# Patient Record
Sex: Female | Born: 1970 | Hispanic: No | Marital: Single | Smoking: Never smoker
Health system: Southern US, Community
[De-identification: ages and names within clinical notes are randomized; demographics above are authoritative.]

## PROBLEM LIST (undated history)

## (undated) DIAGNOSIS — E781 Pure hyperglyceridemia: Secondary | ICD-10-CM

## (undated) DIAGNOSIS — K219 Gastro-esophageal reflux disease without esophagitis: Secondary | ICD-10-CM

## (undated) DIAGNOSIS — R011 Cardiac murmur, unspecified: Secondary | ICD-10-CM

## (undated) DIAGNOSIS — N62 Hypertrophy of breast: Secondary | ICD-10-CM

## (undated) DIAGNOSIS — E282 Polycystic ovarian syndrome: Secondary | ICD-10-CM

## (undated) DIAGNOSIS — I10 Essential (primary) hypertension: Secondary | ICD-10-CM

## (undated) HISTORY — PX: NO PAST SURGERIES: SHX2092

## (undated) HISTORY — DX: Pure hyperglyceridemia: E78.1

---

## 2001-08-15 ENCOUNTER — Other Ambulatory Visit: Admission: RE | Admit: 2001-08-15 | Discharge: 2001-08-15 | Payer: Self-pay | Admitting: *Deleted

## 2002-10-10 ENCOUNTER — Encounter: Admission: RE | Admit: 2002-10-10 | Discharge: 2002-10-10 | Payer: Self-pay | Admitting: *Deleted

## 2003-01-22 ENCOUNTER — Other Ambulatory Visit: Admission: RE | Admit: 2003-01-22 | Discharge: 2003-01-22 | Payer: Self-pay | Admitting: Obstetrics and Gynecology

## 2005-03-03 ENCOUNTER — Other Ambulatory Visit: Admission: RE | Admit: 2005-03-03 | Discharge: 2005-03-03 | Payer: Self-pay | Admitting: Obstetrics and Gynecology

## 2007-10-03 IMAGING — CT CT ABD-PELV W/O CM
3 of 8 series · 12 of 42 positions shown, 18 images · IV contrast (CONTRAST)
Comparison: NONE

CLINICAL DATA: Lower right-sided pains. 

CT OF THE ABDOMEN AND PELVIS WITHOUT AND WITH INTRAVENOUS AND 
FOLLOWING  ORAL CONTRAST
TECHNIQUE: Multiple axial 5 millimeter thick 
slices at 5 millimeter intervals were obtained from the lung base 
through the pelvis following the intravenous administration of 100 
cc???s of Optiray 350 at a rate of 3 cc???s per second. Oral contrast 
was administered as well.  Arterial and venous phase imaging was 
obtained in the upper abdomen with delayed images obtained through 
the pelvis.

[Series 4: venous · axial · portal-venous · 0.76mm/px · z∈[+1184,+1494]mm · 5 of 94 slices shown, 10 images]
[im 16/94  soft-tissue]
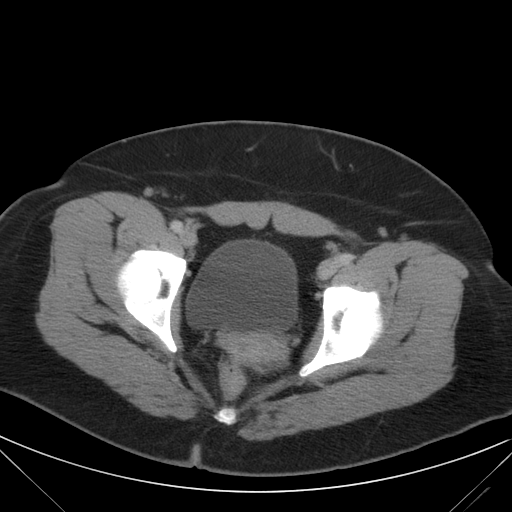
[im 16/94  bone]
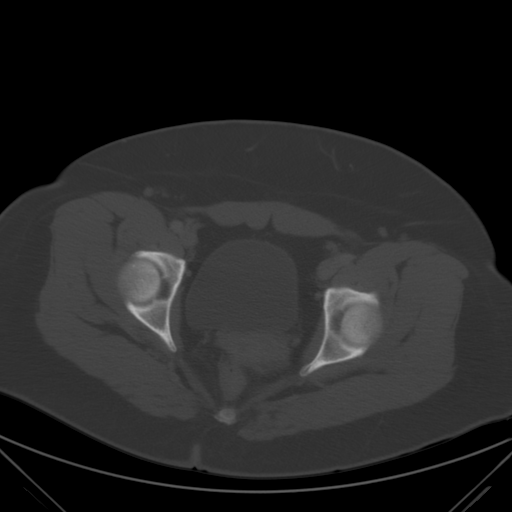
[im 32/94  soft-tissue]
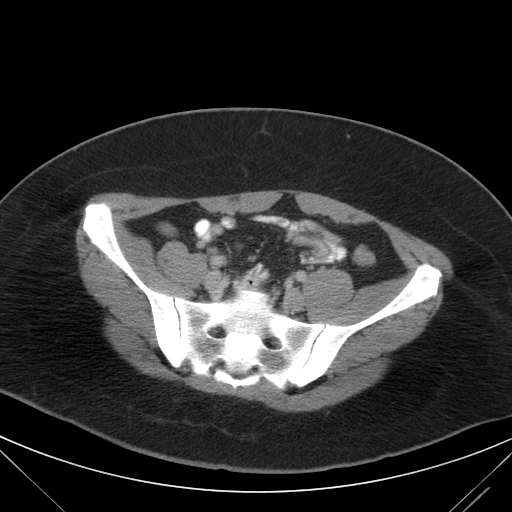
[im 32/94  lung]
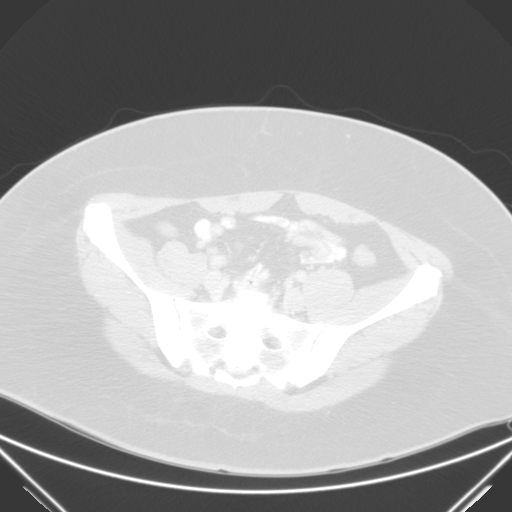
[im 47/94  soft-tissue]
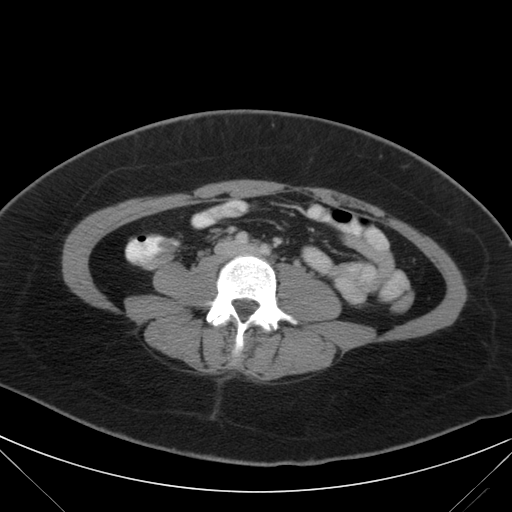
[im 47/94  lung]
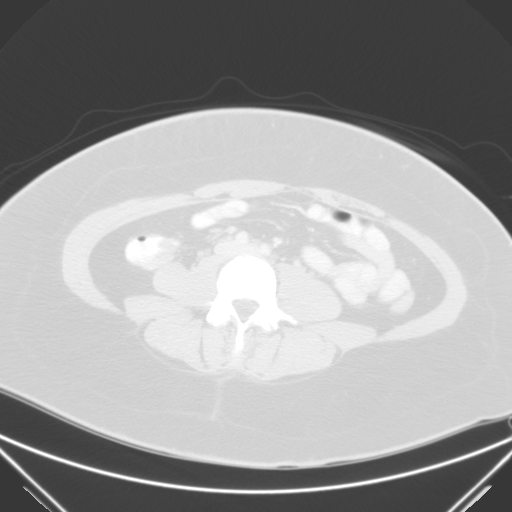
[im 63/94  soft-tissue]
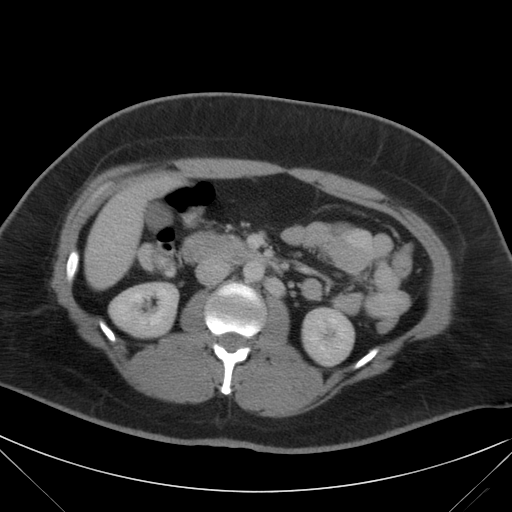
[im 63/94  lung]
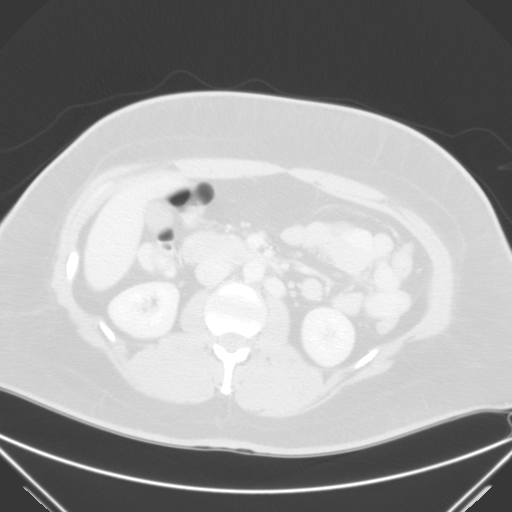
[im 78/94  soft-tissue]
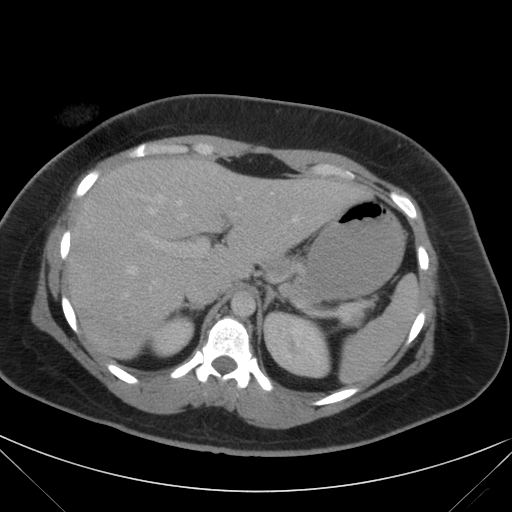
[im 78/94  lung]
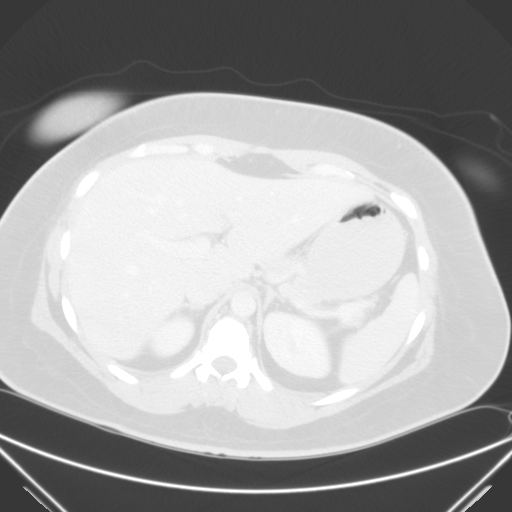

[Series 9: bladder delays · axial · 0.77mm/px · z∈[+1217,+1442]mm · 4 of 75 slices shown]
[im 15/75  soft-tissue]
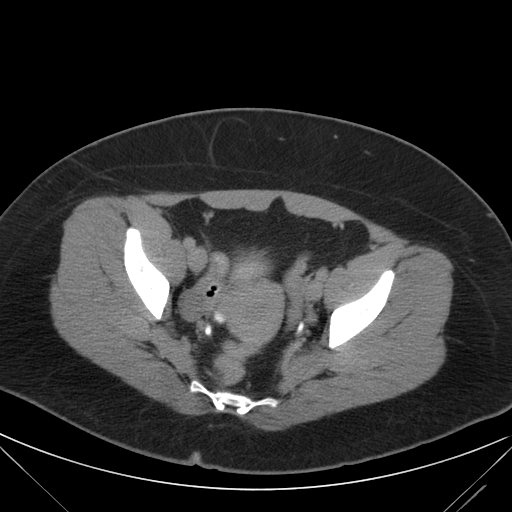
[im 30/75  soft-tissue]
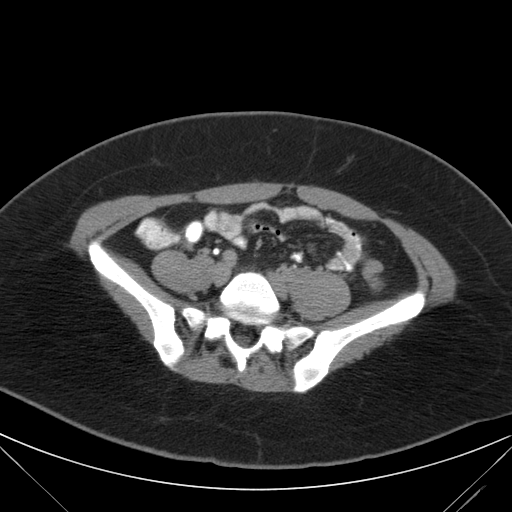
[im 45/75  soft-tissue]
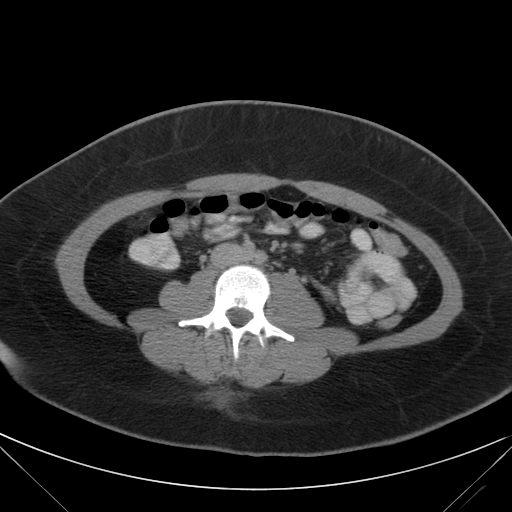
[im 60/75  soft-tissue]
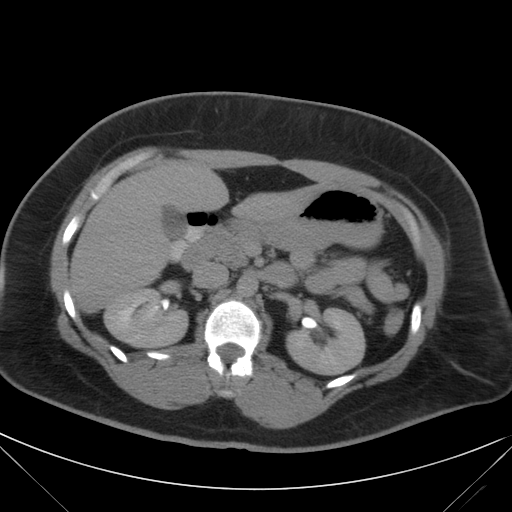

[coronals · coronal · 0.91mm/px · 3 of 70 slices shown, 4 images]
[im 24/70  soft-tissue]
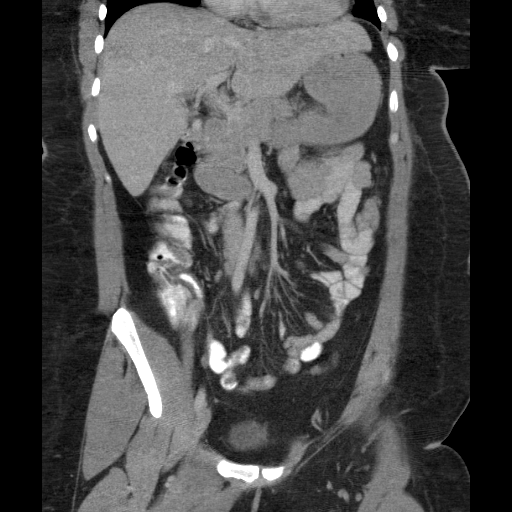
[im 31/70  soft-tissue]
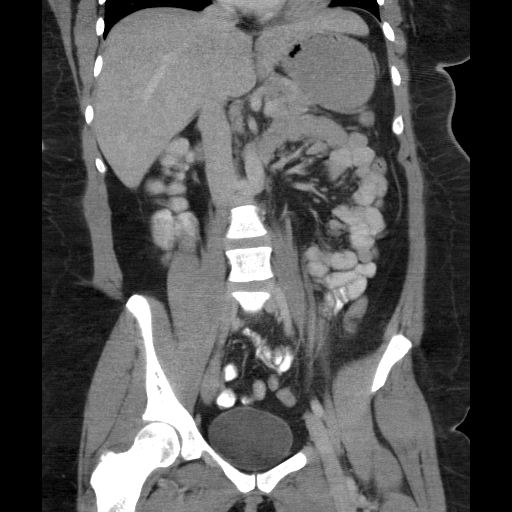
[im 31/70  bone]
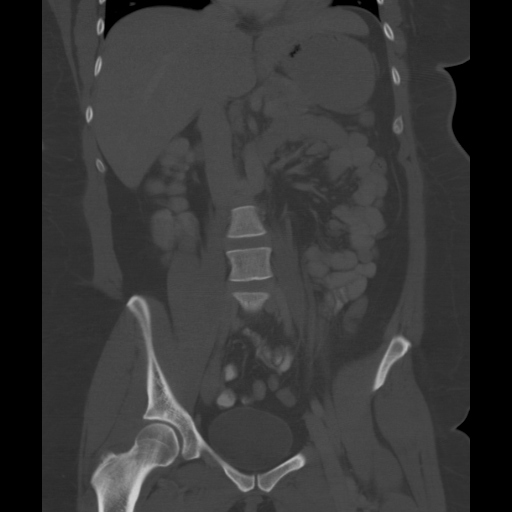
[im 39/70  soft-tissue]
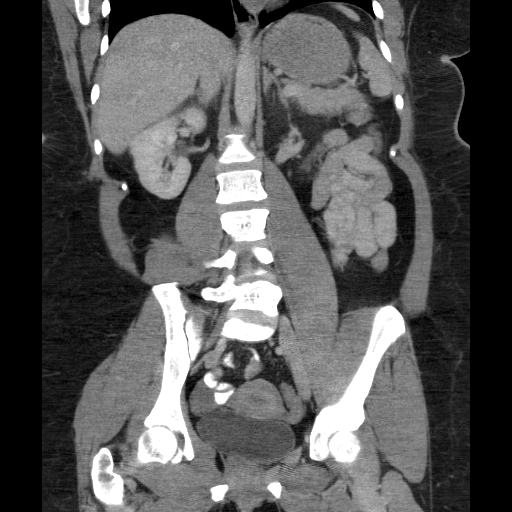

[12 of 42 positions shown; findings below may reference images not displayed]

FINDINGS: No gallstones are identified. No 
renal calculi are identified. The liver, gallbladder, and spleen 
are unremarkable.  No focal or diffuse enlargement is noted of the 
pancreas.  There is no evidence of upper abdominal or 
retroperitoneal mass, adenopathy, or aneurysm. No adrenal or renal 
masses are noted.  There is no evidence of obstructive uropathy. 
No bowel, mesenteric, pelvic, or inguinal mass, adenopathy, or 
inflammatory process. No evidence of appendicitis, diverticulitis, 
hernia, or bowel obstruction. No lung base mass, infiltrate, 
edema, or effusion. No lytic or blastic lesions are identified.
IMPRESSION: Negative CT of the abdomen and pelvis.

## 2017-10-10 ENCOUNTER — Ambulatory Visit: Payer: Self-pay | Admitting: Plastic Surgery

## 2017-10-10 DIAGNOSIS — N62 Hypertrophy of breast: Secondary | ICD-10-CM

## 2017-10-10 NOTE — H&P (View-Only) (Signed)
Michelle Frye is an 47 y.o. female.   Chief Complaint: mammary hypertrophy HPI:  Michelle Frye is a 46 y.o. female with a history of mammary hyperplasia for several years.  She has extremely large breasts causing symptoms that include the following complaints: Back pain (upper and lower) and neck pain. She frequently pins bra cups higher on straps for better lift and relief. Notices relief when holding breast up in her hands. Shoulder straps causing grooves, pain occasionally requiring padding. Pain medication is sometimes required with motrin and tylenol.  She is finding it very difficult to find a bra that fits.  Activities that are hindered by enlarged breasts include: running and exercising. Mammogram history: will need results.  Her breasts are extremely large and fairly symmetric.  She has hyperpigmentation of the inframammary area on both sides.   She is 5 feet 5 inches tall and weighs 200 pounds.   Preoperative bra size = 38 G cup.   Nipple to inframammary fold distance:  12 cm Sternal Noth to nipple distance:  Right = 33 cm, Left = 33 cm Internipple distance:  20 cm Nipple to Midline Distance:  11 cm The estimated excess breast tissue to be removed at the time of surgery = 800 grams on the left and 800 grams on the right.    No past medical history on file.    No family history on file. Social History:  has no tobacco, alcohol, and drug history on file.  Allergies: Allergies not on file   (Not in a hospital admission)  No results found for this or any previous visit (from the past 48 hour(s)). No results found.  Review of Systems  Constitutional: Negative.   HENT: Negative.   Eyes: Negative.   Respiratory: Negative.   Cardiovascular: Negative.   Gastrointestinal: Negative.   Genitourinary: Negative.   Musculoskeletal: Positive for back pain and neck pain.  Skin: Negative.   Neurological: Negative.   Psychiatric/Behavioral: Negative.     There were no vitals  taken for this visit. Physical Exam  Constitutional: She is oriented to person, place, and time. She appears well-developed and well-nourished.  HENT:  Head: Normocephalic and atraumatic.  Eyes: Pupils are equal, round, and reactive to light. EOM are normal.  Cardiovascular: Normal rate.  Respiratory: Effort normal.  GI: Soft.  Neurological: She is alert and oriented to person, place, and time.  Skin: Skin is warm.  Psychiatric: She has a normal mood and affect. Her behavior is normal. Judgment and thought content normal.     Assessment/Plan Bilateral breast reduction.  Risks and complication were discussed.  Adelene Polivka S Kaylinn Dedic, DO 10/10/2017, 9:50 AM   

## 2017-10-10 NOTE — H&P (Signed)
Michelle Frye is an 47 y.o. female.   Chief Complaint: mammary hypertrophy HPI:  Michelle Frye is a 47 y.o. female with a history of mammary hyperplasia for several years.  She has extremely large breasts causing symptoms that include the following complaints: Back pain (upper and lower) and neck pain. She frequently pins bra cups higher on straps for better lift and relief. Notices relief when holding breast up in her hands. Shoulder straps causing grooves, pain occasionally requiring padding. Pain medication is sometimes required with motrin and tylenol.  She is finding it very difficult to find a bra that fits.  Activities that are hindered by enlarged breasts include: running and exercising. Mammogram history: will need results.  Her breasts are extremely large and fairly symmetric.  She has hyperpigmentation of the inframammary area on both sides.   She is 5 feet 5 inches tall and weighs 200 pounds.   Preoperative bra size = 38 G cup.   Nipple to inframammary fold distance:  12 cm Sternal Noth to nipple distance:  Right = 33 cm, Left = 33 cm Internipple distance:  20 cm Nipple to Midline Distance:  11 cm The estimated excess breast tissue to be removed at the time of surgery = 800 grams on the left and 800 grams on the right.    No past medical history on file.    No family history on file. Social History:  has no tobacco, alcohol, and drug history on file.  Allergies: Allergies not on file   (Not in a hospital admission)  No results found for this or any previous visit (from the past 48 hour(s)). No results found.  Review of Systems  Constitutional: Negative.   HENT: Negative.   Eyes: Negative.   Respiratory: Negative.   Cardiovascular: Negative.   Gastrointestinal: Negative.   Genitourinary: Negative.   Musculoskeletal: Positive for back pain and neck pain.  Skin: Negative.   Neurological: Negative.   Psychiatric/Behavioral: Negative.     There were no vitals  taken for this visit. Physical Exam  Constitutional: She is oriented to person, place, and time. She appears well-developed and well-nourished.  HENT:  Head: Normocephalic and atraumatic.  Eyes: Pupils are equal, round, and reactive to light. EOM are normal.  Cardiovascular: Normal rate.  Respiratory: Effort normal.  GI: Soft.  Neurological: She is alert and oriented to person, place, and time.  Skin: Skin is warm.  Psychiatric: She has a normal mood and affect. Her behavior is normal. Judgment and thought content normal.     Assessment/Plan Bilateral breast reduction.  Risks and complication were discussed.  Alena BillsClaire S Aquan Kope, DO 10/10/2017, 9:50 AM

## 2017-10-24 ENCOUNTER — Encounter: Payer: Self-pay | Admitting: Cardiovascular Disease

## 2017-10-24 ENCOUNTER — Ambulatory Visit (HOSPITAL_COMMUNITY): Payer: BC Managed Care – PPO | Attending: Cardiovascular Disease

## 2017-10-24 ENCOUNTER — Ambulatory Visit: Payer: BC Managed Care – PPO | Admitting: Cardiovascular Disease

## 2017-10-24 ENCOUNTER — Other Ambulatory Visit: Payer: Self-pay

## 2017-10-24 VITALS — BP 118/74 | HR 79 | Ht 64.5 in | Wt 213.0 lb

## 2017-10-24 DIAGNOSIS — R011 Cardiac murmur, unspecified: Secondary | ICD-10-CM | POA: Insufficient documentation

## 2017-10-24 DIAGNOSIS — Z8249 Family history of ischemic heart disease and other diseases of the circulatory system: Secondary | ICD-10-CM | POA: Diagnosis not present

## 2017-10-24 DIAGNOSIS — Z01818 Encounter for other preprocedural examination: Secondary | ICD-10-CM | POA: Diagnosis not present

## 2017-10-24 LAB — ECHOCARDIOGRAM COMPLETE
Height: 64.5 in
Weight: 3408 oz

## 2017-10-24 NOTE — Progress Notes (Signed)
10/24/2017 Michelle Frye   30-Aug-1970  [id number]  Primary Physician Michelle Rainier, MD Primary Cardiologist: Runell Gess MD Michelle Fry[place], [place]  HPI:  Michelle Frye is a 47 y.o. mildly overweight single Caucasian female with no children who works as a Hotel manager school.  Referred by Dr. Juluis Frye for preoperative clearance before elective reduction mammoplasty scheduled to be performed by Dr.Dillingham  next Monday.  She has no cardiac risk factors.  Both her parents however had mitral valve replacements.  Never had a heart attack or stroke.  She denies chest pain or shortness of breath.  She does have a murmur which was heard by multiple people.  She had a negative stress test 10 years ago by Dr. Anne Frye in the setting of chest pain which turned out to be just related to anxiety.   Current Meds  Medication Sig  . losartan-hydrochlorothiazide (HYZAAR) 50-12.5 MG tablet Take 1 tablet by mouth daily.  . metFORMIN (GLUCOPHAGE) 500 MG tablet Take 500 mg by mouth daily.  Marland Kitchen PREVIFEM 0.25-35 MG-MCG tablet Take 1 tablet by mouth daily.  Marland Kitchen spironolactone (ALDACTONE) 50 MG tablet Take 50 mg by mouth daily.     No Known Allergies  Social History   Socioeconomic History  . Marital status: Single    Spouse name: Not on file  . Number of children: Not on file  . Years of education: Not on file  . Highest education level: Not on file  Occupational History  . Not on file  Social Needs  . Financial resource strain: Not on file  . Food insecurity:    Worry: Not on file    Inability: Not on file  . Transportation needs:    Medical: Not on file    Non-medical: Not on file  Tobacco Use  . Smoking status: Never Smoker  . Smokeless tobacco: Never Used  Substance and Sexual Activity  . Alcohol use: Not on file  . Drug use: Not on file  . Sexual activity: Not on file  Lifestyle  . Physical activity:    Days per week: Not on file    Minutes per session:  Not on file  . Stress: Not on file  Relationships  . Social connections:    Talks on phone: Not on file    Gets together: Not on file    Attends religious service: Not on file    Active member of club or organization: Not on file    Attends meetings of clubs or organizations: Not on file    Relationship status: Not on file  . Intimate partner violence:    Fear of current or ex partner: Not on file    Emotionally abused: Not on file    Physically abused: Not on file    Forced sexual activity: Not on file  Other Topics Concern  . Not on file  Social History Narrative  . Not on file     Review of Systems: General: negative for chills, fever, night sweats or weight changes.  Cardiovascular: negative for chest pain, dyspnea on exertion, edema, orthopnea, palpitations, paroxysmal nocturnal dyspnea or shortness of breath Dermatological: negative for rash Respiratory: negative for cough or wheezing Urologic: negative for hematuria Abdominal: negative for nausea, vomiting, diarrhea, bright red blood per rectum, melena, or hematemesis Neurologic: negative for visual changes, syncope, or dizziness All other systems reviewed and are otherwise negative except as noted above.    Blood pressure 118/74, pulse 79,  height 5' 4.5" (1.638 m), weight 213 lb (96.6 kg).  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 Extremities: extremities normal, atraumatic, no cyanosis or edema Pulses: 2+ and symmetric Skin: Skin color, texture, turgor normal. No rashes or lesions Neurologic: Alert and oriented X 3, normal strength and tone. Normal symmetric reflexes. Normal coordination and gait  EKG sinus rhythm at 79 without ST or T wave changes.  I personally  reviewed this EKG.  ASSESSMENT AND PLAN:   Cardiac murmur [name] was referred by Michelle RainierElizabeth Frye for preoperative clearance for reduction mammoplasty scheduled to be performed by Dr. Foster Simpsonlaire Frye on 10/30/2017.  He does have a outflow tract murmur on exam.  She is otherwise asymptomatic.  I am going get a 2D echo to further evaluate.  If this is normal she will be cleared for upcoming surgery at low cardiovascular risk and I will see her back as needed.      Runell GessJonathan J. Farrell Broerman MD FACP,FACC,FAHA, [facility] ([facility] CenterFSCAI 10/24/2017 3:12 PM

## 2017-10-24 NOTE — Patient Instructions (Signed)
Medication Instructions:   NO CHANGE  Testing/Procedures:  Your physician has requested that you have an echocardiogram. Echocardiography is a painless test that uses sound waves to create images of your heart. It provides your doctor with information about the size and shape of your heart and how well your heart's chambers and valves are working. This procedure takes approximately one hour. There are no restrictions for this procedure.    Follow-Up:  Your physician recommends that you schedule a follow-up appointment in: AS NEEDED      

## 2017-10-24 NOTE — Addendum Note (Signed)
Addended by: Freddi StarrMATHIS, Mady Oubre W on: 10/24/2017 03:17 PM   Modules accepted: Orders

## 2017-10-24 NOTE — Assessment & Plan Note (Signed)
Ms. Michelle Frye was referred by Juluis RainierElizabeth Barnes for preoperative clearance for reduction mammoplasty scheduled to be performed by Dr. Foster Simpsonlaire Dillingham on 10/30/2017.  He does have a outflow tract murmur on exam.  She is otherwise asymptomatic.  I am going get a 2D echo to further evaluate.  If this is normal she will be cleared for upcoming surgery at low cardiovascular risk and I will see her back as needed.

## 2017-10-25 ENCOUNTER — Ambulatory Visit: Payer: Self-pay | Admitting: Plastic Surgery

## 2017-10-25 ENCOUNTER — Encounter (HOSPITAL_BASED_OUTPATIENT_CLINIC_OR_DEPARTMENT_OTHER): Payer: Self-pay | Admitting: *Deleted

## 2017-10-25 ENCOUNTER — Other Ambulatory Visit: Payer: Self-pay

## 2017-10-26 ENCOUNTER — Encounter (HOSPITAL_BASED_OUTPATIENT_CLINIC_OR_DEPARTMENT_OTHER)
Admission: RE | Admit: 2017-10-26 | Discharge: 2017-10-26 | Disposition: A | Discharge status: Home / Self Care | Payer: BC Managed Care – PPO | Source: Ambulatory Visit | Attending: Plastic Surgery | Admitting: Plastic Surgery

## 2017-10-26 DIAGNOSIS — Z01812 Encounter for preprocedural laboratory examination: Secondary | ICD-10-CM | POA: Insufficient documentation

## 2017-10-26 LAB — BASIC METABOLIC PANEL
Anion gap: 11 (ref 5–15)
BUN: 11 mg/dL (ref 6–20)
CHLORIDE: 101 mmol/L (ref 98–111)
CO2: 26 mmol/L (ref 22–32)
CREATININE: 0.81 mg/dL (ref 0.44–1.00)
Calcium: 9 mg/dL (ref 8.9–10.3)
GFR calc Af Amer: >60 mL/min (ref >60)
GFR calc non Af Amer: >60 mL/min (ref >60)
Glucose, Bld: 89 mg/dL (ref 70–99)
Potassium: 3.8 mmol/L (ref 3.5–5.1)
SODIUM: 138 mmol/L (ref 135–145)

## 2017-10-27 ENCOUNTER — Ambulatory Visit: Payer: Self-pay | Admitting: Plastic Surgery

## 2017-10-30 ENCOUNTER — Encounter (HOSPITAL_BASED_OUTPATIENT_CLINIC_OR_DEPARTMENT_OTHER): Payer: Self-pay | Admitting: Anesthesiology

## 2017-10-30 ENCOUNTER — Ambulatory Visit (HOSPITAL_BASED_OUTPATIENT_CLINIC_OR_DEPARTMENT_OTHER): Payer: BC Managed Care – PPO | Admitting: Anesthesiology

## 2017-10-30 ENCOUNTER — Encounter (HOSPITAL_BASED_OUTPATIENT_CLINIC_OR_DEPARTMENT_OTHER)
Admission: RE | Disposition: A | Discharge status: Home / Self Care | Payer: Self-pay | Source: Ambulatory Visit | Attending: Plastic Surgery

## 2017-10-30 ENCOUNTER — Other Ambulatory Visit: Payer: Self-pay

## 2017-10-30 ENCOUNTER — Ambulatory Visit (HOSPITAL_BASED_OUTPATIENT_CLINIC_OR_DEPARTMENT_OTHER)
Admission: RE | Admit: 2017-10-30 | Discharge: 2017-10-30 | Disposition: A | Discharge status: Home / Self Care | Payer: BC Managed Care – PPO | Source: Ambulatory Visit | Attending: Plastic Surgery | Admitting: Plastic Surgery

## 2017-10-30 DIAGNOSIS — I10 Essential (primary) hypertension: Secondary | ICD-10-CM | POA: Diagnosis not present

## 2017-10-30 DIAGNOSIS — N62 Hypertrophy of breast: Secondary | ICD-10-CM | POA: Diagnosis not present

## 2017-10-30 DIAGNOSIS — Z793 Long term (current) use of hormonal contraceptives: Secondary | ICD-10-CM | POA: Diagnosis not present

## 2017-10-30 DIAGNOSIS — K219 Gastro-esophageal reflux disease without esophagitis: Secondary | ICD-10-CM | POA: Diagnosis not present

## 2017-10-30 HISTORY — DX: Cardiac murmur, unspecified: R01.1

## 2017-10-30 HISTORY — DX: Polycystic ovarian syndrome: E28.2

## 2017-10-30 HISTORY — PX: BREAST REDUCTION SURGERY: SHX8

## 2017-10-30 HISTORY — DX: Gastro-esophageal reflux disease without esophagitis: K21.9

## 2017-10-30 HISTORY — DX: Hypertrophy of breast: N62

## 2017-10-30 HISTORY — DX: Essential (primary) hypertension: I10

## 2017-10-30 SURGERY — MAMMOPLASTY, REDUCTION
Anesthesia: General | Site: Breast | Laterality: Bilateral

## 2017-10-30 MED ORDER — ACETAMINOPHEN 325 MG PO TABS: 650.0000 mg | ORAL_TABLET | ORAL | Status: DC | PRN

## 2017-10-30 MED ORDER — ACETAMINOPHEN 650 MG RE SUPP: 650.0000 mg | RECTAL | Status: DC | PRN

## 2017-10-30 MED ORDER — SODIUM CHLORIDE 0.9 % IV SOLN: 250.0000 mL | INTRAVENOUS | Status: DC | PRN

## 2017-10-30 MED ORDER — FENTANYL CITRATE (PF) 100 MCG/2ML IJ SOLN
INTRAMUSCULAR | Status: AC
  Filled 2017-10-30: qty 2

## 2017-10-30 MED ORDER — KETOROLAC TROMETHAMINE 0.5 % OP SOLN
OPHTHALMIC | Status: AC
  Filled 2017-10-30: qty 5

## 2017-10-30 MED ORDER — FENTANYL CITRATE (PF) 100 MCG/2ML IJ SOLN
50.0000 ug | INTRAMUSCULAR | Status: AC | PRN
  Administered 2017-10-30: 100 ug via INTRAVENOUS
  Administered 2017-10-30: 50 ug via INTRAVENOUS
  Administered 2017-10-30: 25 ug via INTRAVENOUS
  Administered 2017-10-30: 50 ug via INTRAVENOUS

## 2017-10-30 MED ORDER — LACTATED RINGERS IV SOLN
INTRAVENOUS | Status: DC
  Administered 2017-10-30 (×4): via INTRAVENOUS

## 2017-10-30 MED ORDER — BUPIVACAINE-EPINEPHRINE (PF) 0.5% -1:200000 IJ SOLN
INTRAMUSCULAR | Status: AC
  Filled 2017-10-30: qty 60

## 2017-10-30 MED ORDER — PROPOFOL 10 MG/ML IV BOLUS
INTRAVENOUS | Status: DC | PRN
  Administered 2017-10-30: 250 mg via INTRAVENOUS

## 2017-10-30 MED ORDER — LIDOCAINE 2% (20 MG/ML) 5 ML SYRINGE
INTRAMUSCULAR | Status: DC | PRN
  Administered 2017-10-30: 80 mg via INTRAVENOUS

## 2017-10-30 MED ORDER — POLYMYXIN B-TRIMETHOPRIM 10000-0.1 UNIT/ML-% OP SOLN
OPHTHALMIC | Status: AC
  Filled 2017-10-30: qty 10

## 2017-10-30 MED ORDER — DEXAMETHASONE SODIUM PHOSPHATE 4 MG/ML IJ SOLN
INTRAMUSCULAR | Status: DC | PRN
  Administered 2017-10-30: 10 mg via INTRAVENOUS

## 2017-10-30 MED ORDER — POLYMYXIN B-TRIMETHOPRIM 10000-0.1 UNIT/ML-% OP SOLN
1.0000 [drp] | Freq: Three times a day (TID) | OPHTHALMIC | Status: DC
  Administered 2017-10-30: 1 [drp] via OPHTHALMIC

## 2017-10-30 MED ORDER — MIDAZOLAM HCL 2 MG/2ML IJ SOLN
1.0000 mg | INTRAMUSCULAR | Status: DC | PRN
  Administered 2017-10-30: 2 mg via INTRAVENOUS

## 2017-10-30 MED ORDER — TETRACAINE HCL 0.5 % OP SOLN
1.0000 [drp] | Freq: Once | OPHTHALMIC | Status: AC
  Administered 2017-10-30: 1 [drp] via OPHTHALMIC

## 2017-10-30 MED ORDER — SCOPOLAMINE 1 MG/3DAYS TD PT72
1.0000 | MEDICATED_PATCH | Freq: Once | TRANSDERMAL | Status: DC | PRN
  Administered 2017-10-30: 1.5 mg via TRANSDERMAL

## 2017-10-30 MED ORDER — OXYCODONE HCL 5 MG PO TABS: 5.0000 mg | ORAL_TABLET | ORAL | Status: DC | PRN

## 2017-10-30 MED ORDER — CEFAZOLIN SODIUM-DEXTROSE 2-4 GM/100ML-% IV SOLN
2.0000 g | INTRAVENOUS | Status: AC
  Administered 2017-10-30: 2 g via INTRAVENOUS

## 2017-10-30 MED ORDER — EPHEDRINE SULFATE 50 MG/ML IJ SOLN
INTRAMUSCULAR | Status: DC | PRN
  Administered 2017-10-30 (×2): 10 mg via INTRAVENOUS

## 2017-10-30 MED ORDER — SODIUM CHLORIDE 0.9 % IN NEBU
INHALATION_SOLUTION | RESPIRATORY_TRACT | Status: AC
  Filled 2017-10-30: qty 3

## 2017-10-30 MED ORDER — FENTANYL CITRATE (PF) 100 MCG/2ML IJ SOLN
25.0000 ug | INTRAMUSCULAR | Status: DC | PRN
  Administered 2017-10-30 (×2): 25 ug via INTRAVENOUS

## 2017-10-30 MED ORDER — ONDANSETRON HCL 4 MG/2ML IJ SOLN
INTRAMUSCULAR | Status: DC | PRN
  Administered 2017-10-30: 4 mg via INTRAVENOUS

## 2017-10-30 MED ORDER — BUPIVACAINE-EPINEPHRINE 0.25% -1:200000 IJ SOLN
INTRAMUSCULAR | Status: DC | PRN
  Administered 2017-10-30: 20 mL

## 2017-10-30 MED ORDER — ROCURONIUM BROMIDE 100 MG/10ML IV SOLN
INTRAVENOUS | Status: DC | PRN
  Administered 2017-10-30: 50 mg via INTRAVENOUS
  Administered 2017-10-30 (×2): 20 mg via INTRAVENOUS

## 2017-10-30 MED ORDER — SODIUM CHLORIDE 0.9% FLUSH: 3.0000 mL | Freq: Two times a day (BID) | INTRAVENOUS | Status: DC

## 2017-10-30 MED ORDER — MIDAZOLAM HCL 2 MG/2ML IJ SOLN
INTRAMUSCULAR | Status: AC
  Filled 2017-10-30: qty 2

## 2017-10-30 MED ORDER — SODIUM CHLORIDE 0.9% FLUSH: 3.0000 mL | INTRAVENOUS | Status: DC | PRN

## 2017-10-30 MED ORDER — TETRACAINE HCL 0.5 % OP SOLN
OPHTHALMIC | Status: AC
  Filled 2017-10-30: qty 4

## 2017-10-30 MED ORDER — PROMETHAZINE HCL 25 MG/ML IJ SOLN
6.2500 mg | INTRAMUSCULAR | Status: DC | PRN
Start: 2017-10-30 — End: 2017-10-30

## 2017-10-30 MED ORDER — SUGAMMADEX SODIUM 200 MG/2ML IV SOLN
INTRAVENOUS | Status: DC | PRN
  Administered 2017-10-30: 200 mg via INTRAVENOUS

## 2017-10-30 MED ORDER — BSS IO SOLN
15.0000 mL | Freq: Once | INTRAOCULAR | Status: AC
  Administered 2017-10-30: 15 mL

## 2017-10-30 MED ORDER — SCOPOLAMINE 1 MG/3DAYS TD PT72
MEDICATED_PATCH | TRANSDERMAL | Status: AC
  Filled 2017-10-30: qty 1

## 2017-10-30 MED ORDER — LIDOCAINE HCL (PF) 1 % IJ SOLN
INTRAMUSCULAR | Status: AC
  Filled 2017-10-30: qty 90

## 2017-10-30 MED ORDER — BSS IO SOLN
INTRAOCULAR | Status: AC
  Filled 2017-10-30: qty 15

## 2017-10-30 MED ORDER — CEFAZOLIN SODIUM-DEXTROSE 2-4 GM/100ML-% IV SOLN
INTRAVENOUS | Status: AC
  Filled 2017-10-30: qty 100

## 2017-10-30 MED ORDER — KETOROLAC TROMETHAMINE 0.5 % OP SOLN
1.0000 [drp] | Freq: Three times a day (TID) | OPHTHALMIC | Status: DC | PRN
  Administered 2017-10-30: 1 [drp] via OPHTHALMIC

## 2017-10-30 SURGICAL SUPPLY — 66 items
ADH SKN CLS APL DERMABOND .7 (GAUZE/BANDAGES/DRESSINGS)
BAG DECANTER FOR FLEXI CONT (MISCELLANEOUS) ×3 IMPLANT
BINDER BREAST LRG (GAUZE/BANDAGES/DRESSINGS) IMPLANT
BINDER BREAST MEDIUM (GAUZE/BANDAGES/DRESSINGS) IMPLANT
BINDER BREAST XLRG (GAUZE/BANDAGES/DRESSINGS) IMPLANT
BINDER BREAST XXLRG (GAUZE/BANDAGES/DRESSINGS) ×2 IMPLANT
BIOPATCH RED 1 DISK 7.0 (GAUZE/BANDAGES/DRESSINGS) ×2 IMPLANT
BIOPATCH RED 1IN DISK 7.0MM (GAUZE/BANDAGES/DRESSINGS) ×2
BLADE HEX COATED 2.75 (ELECTRODE) ×3 IMPLANT
BLADE KNIFE PERSONA 10 (BLADE) ×6 IMPLANT
BLADE SURG 15 STRL LF DISP TIS (BLADE) IMPLANT
BLADE SURG 15 STRL SS (BLADE)
BNDG GAUZE ELAST 4 BULKY (GAUZE/BANDAGES/DRESSINGS) ×6 IMPLANT
CANISTER SUCT 1200ML W/VALVE (MISCELLANEOUS) ×3 IMPLANT
CHLORAPREP W/TINT 26ML (MISCELLANEOUS) ×3 IMPLANT
COVER BACK TABLE 60X90IN (DRAPES) ×3 IMPLANT
COVER MAYO STAND STRL (DRAPES) ×3 IMPLANT
DECANTER SPIKE VIAL GLASS SM (MISCELLANEOUS) IMPLANT
DERMABOND ADVANCED (GAUZE/BANDAGES/DRESSINGS)
DERMABOND ADVANCED .7 DNX12 (GAUZE/BANDAGES/DRESSINGS) IMPLANT
DRAIN CHANNEL 15F RND FF W/TCR (WOUND CARE) ×4 IMPLANT
DRAIN CHANNEL 19F RND (DRAIN) IMPLANT
DRAPE LAPAROSCOPIC ABDOMINAL (DRAPES) ×3 IMPLANT
DRSG PAD ABDOMINAL 8X10 ST (GAUZE/BANDAGES/DRESSINGS) ×10 IMPLANT
ELECT BLADE 4.0 EZ CLEAN MEGAD (MISCELLANEOUS) ×3
ELECT REM PT RETURN 9FT ADLT (ELECTROSURGICAL) ×3
ELECTRODE BLDE 4.0 EZ CLN MEGD (MISCELLANEOUS) IMPLANT
ELECTRODE REM PT RTRN 9FT ADLT (ELECTROSURGICAL) ×1 IMPLANT
EVACUATOR SILICONE 100CC (DRAIN) ×6 IMPLANT
GAUZE SPONGE 4X4 12PLY STRL LF (GAUZE/BANDAGES/DRESSINGS) ×3 IMPLANT
GLOVE BIO SURGEON STRL SZ 6.5 (GLOVE) ×6 IMPLANT
GLOVE BIO SURGEONS STRL SZ 6.5 (GLOVE) ×3
GOWN STRL REUS W/ TWL LRG LVL3 (GOWN DISPOSABLE) ×3 IMPLANT
GOWN STRL REUS W/TWL LRG LVL3 (GOWN DISPOSABLE) ×3
NDL HYPO 25X1 1.5 SAFETY (NEEDLE) ×1 IMPLANT
NDL SAFETY ECLIPSE 18X1.5 (NEEDLE) IMPLANT
NEEDLE HYPO 18GX1.5 SHARP (NEEDLE)
NEEDLE HYPO 25X1 1.5 SAFETY (NEEDLE) ×3 IMPLANT
NS IRRIG 1000ML POUR BTL (IV SOLUTION) IMPLANT
PACK BASIN DAY SURGERY FS (CUSTOM PROCEDURE TRAY) ×3 IMPLANT
PAD ALCOHOL SWAB (MISCELLANEOUS) IMPLANT
PENCIL BUTTON HOLSTER BLD 10FT (ELECTRODE) ×3 IMPLANT
PIN SAFETY STERILE (MISCELLANEOUS) IMPLANT
SLEEVE SCD COMPRESS KNEE MED (MISCELLANEOUS) ×3 IMPLANT
SPONGE LAP 18X18 RF (DISPOSABLE) ×8 IMPLANT
STRIP SUTURE WOUND CLOSURE 1/2 (SUTURE) ×6 IMPLANT
SUT MNCRL AB 4-0 PS2 18 (SUTURE) ×16 IMPLANT
SUT MON AB 3-0 SH 27 (SUTURE) ×3
SUT MON AB 3-0 SH27 (SUTURE) ×1 IMPLANT
SUT MON AB 5-0 PS2 18 (SUTURE) ×6 IMPLANT
SUT PDS 3-0 CT2 (SUTURE)
SUT PDS AB 2-0 CT2 27 (SUTURE) IMPLANT
SUT PDS II 3-0 CT2 27 ABS (SUTURE) IMPLANT
SUT SILK 3 0 PS 1 (SUTURE) IMPLANT
SYR 3ML 23GX1 SAFETY (SYRINGE) ×3 IMPLANT
SYR 50ML LL SCALE MARK (SYRINGE) IMPLANT
SYR BULB IRRIGATION 50ML (SYRINGE) ×3 IMPLANT
SYR CONTROL 10ML LL (SYRINGE) ×3 IMPLANT
TAPE MEASURE VINYL STERILE (MISCELLANEOUS) ×3 IMPLANT
TOWEL GREEN STERILE FF (TOWEL DISPOSABLE) ×6 IMPLANT
TUBE CONNECTING 20'X1/4 (TUBING) ×1
TUBE CONNECTING 20X1/4 (TUBING) ×2 IMPLANT
TUBING INFILTRATION IT-10001 (TUBING) IMPLANT
TUBING SET GRADUATE ASPIR 12FT (MISCELLANEOUS) IMPLANT
UNDERPAD 30X30 (UNDERPADS AND DIAPERS) ×6 IMPLANT
YANKAUER SUCT BULB TIP NO VENT (SUCTIONS) ×3 IMPLANT

## 2017-10-30 NOTE — Interval H&P Note (Signed)
History and Physical Interval Note:  10/30/2017 12:53 PM  Michelle DupesEleanor Frye  has presented today for surgery, with the diagnosis of SYMTOMATIC MAMMARY HYPERTEROPHY  The various methods of treatment have been discussed with the patient and family. After consideration of risks, benefits and other options for treatment, the patient has consented to  Procedure(s): BILATERAL MAMMARY REDUCTION  (BREAST) (Bilateral) as a surgical intervention .  The patient's history has been reviewed, patient examined, no change in status, stable for surgery.  I have reviewed the patient's chart and labs.  Questions were answered to the patient's satisfaction.     Alena Billslaire S Dillingham

## 2017-10-30 NOTE — Discharge Instructions (Signed)
Continue breast binder No heavy lifting over 5 lbs No Shower Empty JP drains twice daily and record amount   JP The Sherwin-WilliamsDrain Totals  Bring this sheet to all of your post-operative appointments while you have your drains.  Please measure your drains by CC's or ML's.  Make sure you drain and measure your JP Drains 2 or 3 times per day.  At the end of each day, add up totals for the left side and add up totals for the right side.    ( 9 am )     ( 3 pm )        ( 9 pm )                Date L  R  L  R  L  R  Total L/R                                                                                                                                                                                           Post Anesthesia Home Care Instructions  Activity: Get plenty of rest for the remainder of the day. A responsible individual must stay with you for 24 hours following the procedure.  For the next 24 hours, DO NOT: -Drive a car -Advertising copywriterperate machinery -Drink alcoholic beverages -Take any medication unless instructed by your physician -Make any legal decisions or sign important papers.  Meals: Start with liquid foods such as gelatin or soup. Progress to regular foods as tolerated. Avoid greasy, spicy, heavy foods. If nausea and/or vomiting occur, drink only clear liquids until the nausea and/or vomiting subsides. Call your physician if vomiting continues.  Special Instructions/Symptoms: Your throat may feel dry or sore from the anesthesia or the breathing tube placed in your throat during surgery. If this causes discomfort, gargle with warm salt water. The discomfort should disappear within 24 hours.  If you had a scopolamine patch placed behind your ear for the management of post- operative nausea and/or vomiting:  1. The medication in the patch is effective for 72 hours, after which it should be removed.  Wrap patch in a tissue and discard in the trash. Wash hands thoroughly with soap  and water. 2. You may remove the patch earlier than 72 hours if you experience unpleasant side effects which may include dry mouth, dizziness or visual disturbances. 3. Avoid touching the patch. Wash your hands with soap and water after contact with the patch.    _______________  Ketorolac (Acular) ophthalmic solution 1 drop in left eye every 8 hours as needed for eye pain.  Last dose given at 5:30PM Trimethoprim-polymyxin  b (Polytrim) ophthalmic solution 1 drop in left eye every 8 hours. Last dose given at 6:00PM  If you continue to have left eye pain tomorrow after 12noon please call the Surgery Center at [phone].

## 2017-10-30 NOTE — Anesthesia Preprocedure Evaluation (Addendum)
Anesthesia Evaluation  Patient identified by MRN, date of birth, ID band Patient awake    Reviewed: Allergy & Precautions, NPO status , Patient's Chart, lab work & pertinent test results  Airway Mallampati: II  TM Distance: >3 FB Neck ROM: Full    Dental  (+) Dental Advisory Given   Pulmonary neg pulmonary ROS,    breath sounds clear to auscultation       Cardiovascular hypertension, Pt. on medications  Rhythm:Regular Rate:Normal     Neuro/Psych negative neurological ROS     GI/Hepatic Neg liver ROS, GERD  ,  Endo/Other  negative endocrine ROS  Renal/GU negative Renal ROS     Musculoskeletal   Abdominal   Peds  Hematology negative hematology ROS (+)   Anesthesia Other Findings   Reproductive/Obstetrics                            Anesthesia Physical Anesthesia Plan  ASA: II  Anesthesia Plan: General   Post-op Pain Management:    Induction: Intravenous  PONV Risk Score and Plan: 3 and Dexamethasone, Ondansetron, Midazolam and Treatment may vary due to age or medical condition  Airway Management Planned: Oral ETT  Additional Equipment:   Intra-op Plan:   Post-operative Plan: Extubation in OR  Informed Consent: I have reviewed the patients History and Physical, chart, labs and discussed the procedure including the risks, benefits and alternatives for the proposed anesthesia with the patient or authorized representative who has indicated his/her understanding and acceptance.   Dental advisory given  Plan Discussed with:   Anesthesia Plan Comments:        Anesthesia Quick Evaluation

## 2017-10-30 NOTE — Anesthesia Procedure Notes (Signed)
Procedure Name: Intubation Performed by: Helyne Genther M, CRNA Pre-anesthesia Checklist: Patient identified, Emergency Drugs available, Suction available, Patient being monitored and Timeout performed Patient Re-evaluated:Patient Re-evaluated prior to induction Oxygen Delivery Method: Circle system utilized Preoxygenation: Pre-oxygenation with 100% oxygen Induction Type: IV induction Ventilation: Mask ventilation without difficulty Laryngoscope Size: Mac and 3 Grade View: Grade I Tube type: Oral Tube size: 7.0 mm Number of attempts: 1 Airway Equipment and Method: Stylet Placement Confirmation: ETT inserted through vocal cords under direct vision,  positive ETCO2,  CO2 detector and breath sounds checked- equal and bilateral Secured at: 21 cm Tube secured with: Tape Dental Injury: Teeth and Oropharynx as per pre-operative assessment        

## 2017-10-30 NOTE — Transfer of Care (Signed)
Immediate Anesthesia Transfer of Care Note  Patient: Michelle Frye  Procedure(s) Performed: BILATERAL MAMMARY REDUCTION  (BREAST) (Bilateral Breast)  Patient Location: PACU  Anesthesia Type:General  Level of Consciousness: awake, alert  and oriented  Airway & Oxygen Therapy: Patient Spontanous Breathing and Patient connected to face mask oxygen  Post-op Assessment: Report given to RN and Post -op Vital signs reviewed and stable  Post vital signs: Reviewed and stable  Last Vitals:  Vitals Value Taken Time  BP    Temp    Pulse 92 10/30/2017  4:12 PM  Resp 5 10/30/2017  4:12 PM  SpO2 91 % 10/30/2017  4:12 PM  Vitals shown include unvalidated device data.  Last Pain:  Vitals:   10/30/17 1050  TempSrc: Oral         Complications: No apparent anesthesia complications

## 2017-10-30 NOTE — Op Note (Signed)
First Assist Op Note: [facility] I assisted the Surgeon(s) ___Dr. Alan Ripperlaire Dillingham__ on the procedure(s): __Bilateral breast reduction _____on Date __7/01/2019_______  I provided my assistance on this case as follows:  I was present and acted as first Geophysicist/field seismologistassistant during this operation. I was present during the patient transport into the operative suite and assisted the OR staff with transferring and positioning of the patient. All extremities were checked and properly cushioned and safety straps in place. I was involved in the prepping and placement of sterile drapes. A time out was performed and all information confirmed to be correct.  I first assisted during the case including retraction for exposure, assisting with closure of surgical wounds and application of sterile dressings. I provided assistance with application of post operative garments/splinting and assisted with patient transfer back to the stretcher as needed.   RAYBURN,SHAWN,PA-C Plastic Surgery [phone]

## 2017-10-30 NOTE — Op Note (Signed)
Breast Reduction Op note:    DATE OF PROCEDURE: 10/30/2017  [facility]  SURGEON: Alan Ripperlaire Sanger Hadrian Yarbrough, DO  ASSISTANT: Shawn Rayburn, PA  PREOPERATIVE DIAGNOSIS 1. Macromastia 2. Neck Pain 3. Back Pain  POSTOPERATIVE DIAGNOSIS 1. Macromastia 2. Neck Pain 3. Back Pain  PROCEDURES 1. Bilateral breast reduction.  Right reduction 626g, Left reduction 655g  COMPLICATIONS: None.  DRAINS: 2 JP  INDICATIONS FOR PROCEDURE @FNAMEA @ Rodger is a 47 y.o. year-old female born on 02/21/1971,with a history of symptomatic macromastia with concominant back pain, neck pain, shoulder grooving from her bra.   MRN: [id number]  CONSENT Informed consent was obtained directly from the patient. The risks, benefits and alternatives were fully discussed. Specific risks including but not limited to bleeding, infection, hematoma, seroma, scarring, pain, nipple necrosis, asymmetry, poor cosmetic results, and need for further surgery were discussed. The patient had ample opportunity to have her questions answered to her satisfaction.  DESCRIPTION OF PROCEDURE  Patient was brought into the operating room and placed in a supine position.  SCDs were placed and appropriate padding was performed.  Antibiotics were given. The patient underwent general anesthesia and the chest was prepped and draped in a sterile fashion.  A timeout was performed and all information was confirmed to be correct.  Right side: Preoperative markings were confirmed.  Incision lines were injected with 1% Xylocaine with epinephrine.  After waiting for vasoconstriction, the marked lines were incised.  A Wise-pattern superomedial breast reduction was performed by de-epithelializing the pedicle, using bovie to create the superomedial pedicle, and removing breast tissue from the superior, lateral, and inferior portions of the breast.  Care was taken to not undermine the breast pedicle. Hemostasis was achieved.   The nipple was gently rotated into position and the soft tissue closed with 4-0 Monocryl.   The pocket was irrigated and hemostasis confirmed.  The deep tissues were approximated with 3-0 monocryl sutures and the skin was closed with deep dermal and subcuticular 4-0 Monocryl sutures.  The nipple and skin flaps had good capillary refill at the end of the procedure.    Left side: Preoperative markings were confirmed.  Incision lines were injected with 1% Xylocaine with epinephrine.  After waiting for vasoconstriction, the marked lines were incised.  A Wise-pattern superomedial breast reduction was performed by de-epithelializing the pedicle, using bovie to create the superomedial pedicle, and removing breast tissue from the superior, lateral, and inferior portions of the breast.  Care was taken to not undermine the breast pedicle. Hemostasis was achieved.  The nipple was gently rotated into position and the soft tissue was closed with 4-0 Monocryl.  The patient was sat upright and size and shape symmetry was confirmed.  The pocket was irrigated and hemostasis confirmed.  The deep tissues were approximated with 3-0 monocryl sutures and the skin was closed with deep dermal and subcuticular 4-0 Monocryl sutures.  Dermabond was applied.  A breast binder and ABDs were placed.  The nipple and skin flaps had good capillary refill at the end of the procedure.  The patient tolerated the procedure well. The patient was allowed to wake from anesthesia and taken to the recovery room in satisfactory condition.  The advanced practice practitioner assisted throughout the case.  The APP was essential in retraction and counter traction when needed to make the case progress smoothly.  The assistance was needed for blood control, tissue re-approximation and assisted with closure of the incision site.

## 2017-10-30 NOTE — Anesthesia Postprocedure Evaluation (Signed)
Anesthesia Post Note  Patient: Michelle Frye  Procedure(s) Performed: BILATERAL MAMMARY REDUCTION  (BREAST) (Bilateral Breast)     Patient location during evaluation: PACU Anesthesia Type: General Level of consciousness: awake and alert Pain management: pain level controlled Vital Signs Assessment: post-procedure vital signs reviewed and stable Respiratory status: spontaneous breathing, nonlabored ventilation, respiratory function stable and patient connected to nasal cannula oxygen Cardiovascular status: blood pressure returned to baseline and stable Postop Assessment: no apparent nausea or vomiting Anesthetic complications: yes Anesthetic complication details: Possible corneal abrasion. Pt given toradol and antibiotic eye drops and instructed to call back on 10/31/17 if not significantly better so we can try to arrange opthalmology consult. and injury of cornea   Last Vitals:  Vitals:   10/30/17 1715 10/30/17 1721  BP: 132/74   Pulse: 89 97  Resp: 14 14  Temp:    SpO2: 94% 96%    Last Pain:  Vitals:   10/30/17 1645  TempSrc:   PainSc: 5                  Kennieth RadFitzgerald, Leory Allinson E

## 2017-10-31 ENCOUNTER — Encounter (HOSPITAL_BASED_OUTPATIENT_CLINIC_OR_DEPARTMENT_OTHER): Payer: Self-pay | Admitting: Plastic Surgery

## 2019-02-11 ENCOUNTER — Telehealth: Payer: Self-pay | Admitting: Plastic Surgery

## 2019-02-12 ENCOUNTER — Other Ambulatory Visit: Payer: Self-pay

## 2019-02-12 ENCOUNTER — Ambulatory Visit: Payer: BC Managed Care – PPO | Admitting: Plastic Surgery

## 2019-02-12 ENCOUNTER — Encounter: Payer: Self-pay | Admitting: Plastic Surgery

## 2019-02-12 DIAGNOSIS — Z9889 Other specified postprocedural states: Secondary | ICD-10-CM | POA: Insufficient documentation

## 2019-02-12 NOTE — Progress Notes (Signed)
Patient ID: Michelle Frye, female    DOB: 04-Jul-1970, 48 y.o.   MRN: [id number]   Chief Complaint  Patient presents with  . Breast Problem    The patient is a 48 years old white female here for a one-year follow-up after undergoing bilateral breast reduction.  She is still extremely happy with her surgery.  She states she has absolutely no regrets.  She is very pleased with her size and shape.  She is not had the same kind of neck and back pain since the surgery.  She still goes to physical therapy because of some chronic back pain.  She had a mammogram last November and it was negative.  She will have 1 next month.  No lumps or bumps noted in the left breast.  She has a small area of fat necrosis in the right breast.  It is at the 11 o'clock position.  It is not tender to palpation.  She has not been doing any massage on it.   Review of Systems  Constitutional: Negative.  Negative for activity change and appetite change.  HENT: Negative.   Eyes: Negative.   Respiratory: Negative.  Negative for chest tightness and shortness of breath.   Cardiovascular: Negative.  Negative for leg swelling.  Gastrointestinal: Negative.  Negative for abdominal distention.  Endocrine: Negative.   Genitourinary: Negative.   Musculoskeletal: Negative.  Negative for back pain.  Skin: Negative for color change and wound.  Hematological: Negative.   Psychiatric/Behavioral: Negative.     Past Medical History:  Diagnosis Date  . GERD (gastroesophageal reflux disease)   . Heart murmur    ECHO 10-24-17 normal  . Hypertension   . PCOS (polycystic ovarian syndrome)   . Symptomatic mammary hypertrophy     Past Surgical History:  Procedure Laterality Date  . BREAST REDUCTION SURGERY Bilateral 10/30/2017   Procedure: BILATERAL MAMMARY REDUCTION  (BREAST);  Surgeon: Peggye Form, DO;  Location: [facility];  Service: Plastics;  Laterality: Bilateral;  . NO PAST SURGERIES        Current  Outpatient Medications:  .  esomeprazole (NEXIUM) 20 MG capsule, Take 20 mg by mouth daily at 12 noon., Disp: , Rfl:  .  losartan-hydrochlorothiazide (HYZAAR) 50-12.5 MG tablet, Take 1 tablet by mouth daily., Disp: , Rfl:  .  metFORMIN (GLUCOPHAGE) 500 MG tablet, Take 500 mg by mouth 3 (three) times daily. , Disp: , Rfl:  .  Multiple Vitamin (MULTIVITAMIN WITH MINERALS) TABS tablet, Take 1 tablet by mouth daily., Disp: , Rfl:  .  PREVIFEM 0.25-35 MG-MCG tablet, Take 1 tablet by mouth daily., Disp: , Rfl:  .  spironolactone (ALDACTONE) 50 MG tablet, Take 50 mg by mouth daily., Disp: , Rfl:    Objective:   Vitals:   02/12/19 1550  BP: (!) 134/92  Pulse: 86  Temp: 98.1 F (36.7 C)  SpO2: 98%    Physical Exam Vitals signs and nursing note reviewed.  Constitutional:      Appearance: Normal appearance.  HENT:     Head: Normocephalic and atraumatic.  Cardiovascular:     Rate and Rhythm: Normal rate.  Pulmonary:     Effort: Pulmonary effort is normal. No respiratory distress.     Breath sounds: No wheezing.  Abdominal:     General: Abdomen is flat. There is no distension.     Tenderness: There is no abdominal tenderness.  Skin:    General: Skin is warm.  Capillary Refill: Capillary refill takes less than 2 seconds.  Neurological:     General: No focal deficit present.     Mental Status: She is alert and oriented to person, place, and time.  Psychiatric:        Mood and Affect: Mood normal.        Behavior: Behavior normal.        Thought Content: Thought content normal.     Assessment & Plan:  History of bilateral breast reduction surgery  Recommend massage to right breast fat necrosis.  We will wait to see what the mammogram shows.  If the mammogram shows any concern we will investigate further.  If it gets any larger we will also reevaluate.  It is most likely fat necrosis and I highly recommend massage to the area.  Patient is fine with that does not want to do anything  further.  She will let us know after she has the mammogram.  I like to see her back in a year.  [place], DO

## 2019-11-05 ENCOUNTER — Ambulatory Visit: Payer: BC Managed Care – PPO | Admitting: Podiatry

## 2020-01-22 ENCOUNTER — Other Ambulatory Visit: Payer: Self-pay | Admitting: Gastroenterology

## 2020-01-22 ENCOUNTER — Other Ambulatory Visit (HOSPITAL_COMMUNITY): Payer: Self-pay | Admitting: Gastroenterology

## 2020-01-22 DIAGNOSIS — R142 Eructation: Secondary | ICD-10-CM

## 2020-01-22 DIAGNOSIS — R1084 Generalized abdominal pain: Secondary | ICD-10-CM

## 2020-02-24 ENCOUNTER — Encounter (HOSPITAL_COMMUNITY)
Admission: RE | Admit: 2020-02-24 | Discharge: 2020-02-24 | Disposition: A | Discharge status: Home / Self Care | Payer: BC Managed Care – PPO | Source: Ambulatory Visit | Attending: Gastroenterology | Admitting: Gastroenterology

## 2020-02-24 ENCOUNTER — Other Ambulatory Visit: Payer: Self-pay

## 2020-02-24 DIAGNOSIS — R142 Eructation: Secondary | ICD-10-CM | POA: Insufficient documentation

## 2020-02-24 DIAGNOSIS — R1084 Generalized abdominal pain: Secondary | ICD-10-CM | POA: Insufficient documentation

## 2020-02-24 MED ORDER — TECHNETIUM TC 99M SULFUR COLLOID
2.0000 | Freq: Once | INTRAVENOUS | Status: AC | PRN
  Administered 2020-02-24: 2 via INTRAVENOUS

## 2020-04-13 ENCOUNTER — Encounter: Payer: Self-pay | Admitting: *Deleted

## 2020-04-14 ENCOUNTER — Other Ambulatory Visit: Payer: Self-pay

## 2020-04-14 ENCOUNTER — Encounter: Payer: Self-pay | Admitting: Diagnostic Neuroimaging

## 2020-04-14 ENCOUNTER — Ambulatory Visit: Payer: BC Managed Care – PPO | Admitting: Diagnostic Neuroimaging

## 2020-04-14 VITALS — BP 167/80 | HR 90 | Ht 65.7 in | Wt 233.2 lb

## 2020-04-14 DIAGNOSIS — R419 Unspecified symptoms and signs involving cognitive functions and awareness: Secondary | ICD-10-CM | POA: Diagnosis not present

## 2020-04-14 NOTE — Patient Instructions (Addendum)
°-   daily physical activity / exercise (at least 15-30 minutes) - eat more plants / vegetables - increase social activities, brain stimulation, games, puzzles, hobbies, crafts, arts, music - aim for at least 7-8 hours sleep per night (or more)

## 2020-04-14 NOTE — Progress Notes (Signed)
[facility]  PATIENT: Michelle Frye DOB: [birth year]  REFERRING CLINICIAN: Juluis Rainier, MD HISTORY FROM: patient  REASON FOR VISIT: new consult    HISTORICAL  CHIEF COMPLAINT:  Chief Complaint  Patient presents with  . New Patient (Initial Visit)    "I think I have an undiagnosed learning disability, constantly mixing up words" Alone in room    HISTORY OF PRESENT ILLNESS:   49 year old female here for evaluation of mild cognitive difficulties.  Patient reports some concern about cognitive abilities lately in the past 2 to 3 years.  She has had long history of reversing letters, mild memory issues, slow reading, but did well in school and graduated with a bachelor's in art education and was on the Dean's list.  In last to 3 years patient has noticed more issues related to numbers, word retrieval, memory recall.  No issues with sleep or stress.  She does have some mild anxiety issues.  No change in her day-to-day activities.  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  She lives on her own and takes care of all ADLs.   REVIEW OF SYSTEMS: Full 14 system review of systems performed and negative with exception of: As per HPI.  ALLERGIES: Allergies  Allergen Reactions  . Lisinopril Other (See Comments)    cough    HOME MEDICATIONS: Outpatient Medications Prior to Visit  Medication Sig Dispense Refill  . ferrous sulfate 324 MG TBEC Take 324 mg by mouth every other day.    . fexofenadine (ALLEGRA) 180 MG tablet Take 180 mg by mouth daily.    . metFORMIN (GLUCOPHAGE) 500 MG tablet Take 500 mg by mouth 3 (three) times daily.     . Multiple Vitamin (MULTIVITAMIN WITH MINERALS) TABS tablet Take 1 tablet by mouth daily.    . pantoprazole (PROTONIX) 40 MG tablet Take 40 mg by mouth 2 (two) times daily.    Marland Kitchen PREVIFEM 0.25-35 MG-MCG tablet Take 1 tablet by  mouth daily.    Marland Kitchen spironolactone (ALDACTONE) 50 MG tablet Take 50 mg by mouth daily.    Marland Kitchen esomeprazole (NEXIUM) 20 MG capsule Take 20 mg by mouth daily at 12 noon.    Marland Kitchen losartan-hydrochlorothiazide (HYZAAR) 50-12.5 MG tablet Take 1 tablet by mouth daily.     No facility-administered medications prior to visit.    PAST MEDICAL HISTORY: Past Medical History:  Diagnosis Date  . GERD (gastroesophageal reflux disease)   . Heart murmur    ECHO 10-24-17 normal  . Hypertension   . Hypertriglyceridemia   . PCOS (polycystic ovarian syndrome)   . Symptomatic mammary hypertrophy     PAST SURGICAL HISTORY: Past Surgical History:  Procedure Laterality Date  . BREAST REDUCTION SURGERY Bilateral 10/30/2017   Procedure: BILATERAL MAMMARY REDUCTION  (BREAST);  Surgeon: Peggye Form, DO;  Location: [facility];  Service: Plastics;  Laterality: Bilateral;  . NO PAST SURGERIES      FAMILY HISTORY: Family History  Problem Relation Age of Onset  . Heart disease Mother   . Heart disease Father     SOCIAL HISTORY: Social History   Socioeconomic History  . Marital status: Single    Spouse name: Not on file  . Number of children: Not on file  . Years of education: Not on file  . Highest education level: Not on file  Occupational History  . Occupation:  full time    Comment: teacher  Tobacco Use  . Smoking status: Never Smoker  . Smokeless tobacco: Never Used  Substance and Sexual Activity  . Alcohol use: Never  . Drug use: Never  . Sexual activity: Not on file  Other Topics Concern  . Not on file  Social History Narrative   Lives alone   Right handed   Drinks 3-4 cups/week   Social Determinants of Health   Financial Resource Strain: Not on file  Food Insecurity: Not on file  Transportation Needs: Not on file  Physical Activity: Not on file  Stress: Not on file  Social Connections: Not on file  Intimate Partner Violence: Not on file     PHYSICAL  EXAM  GENERAL EXAM/CONSTITUTIONAL: Vitals:  Vitals:   04/14/20 1535  BP: (!) 167/80  Pulse: 90  Weight: 233 lb 3.2 oz (105.8 kg)  Height: 5' 5.7" (1.669 m)     Body mass index is 37.98 kg/m. Wt Readings from Last 3 Encounters:  04/14/20 233 lb 3.2 oz (105.8 kg)  10/30/17 212 lb (96.2 kg)  10/24/17 213 lb (96.6 kg)     Patient is in no distress; well developed, nourished and groomed; neck is supple  CARDIOVASCULAR:  Examination of carotid arteries is normal; no carotid bruits  Regular rate and rhythm, no murmurs  Examination of peripheral vascular system by observation and palpation is normal  EYES:  Ophthalmoscopic exam of optic discs and posterior segments is normal; no papilledema or hemorrhages  No exam data present  MUSCULOSKELETAL:  Gait, strength, tone, movements noted in Neurologic exam below  NEUROLOGIC: MENTAL STATUS:  MMSE - Mini Mental State Exam 04/14/2020  Orientation to time 5  Orientation to Place 5  Registration 3  Attention/ Calculation 5  Recall 3  Language- name 2 objects 2  Language- repeat 1  Language- follow 3 step command 3  Language- read & follow direction 1  Write a sentence 1  Copy design 1  Total score 30    awake, alert, oriented to person, place and time  recent and remote memory intact  normal attention and concentration  language fluent, comprehension intact, naming intact  fund of knowledge appropriate  CRANIAL NERVE:   2nd - no papilledema on fundoscopic exam  2nd, 3rd, 4th, 6th - pupils equal and reactive to light, visual fields full to confrontation, extraocular muscles intact, no nystagmus  5th - facial sensation symmetric  7th - facial strength symmetric  8th - hearing intact  9th - palate elevates symmetrically, uvula midline  11th - shoulder shrug symmetric  12th - tongue protrusion midline  MOTOR:   normal bulk and tone, full strength in the BUE, BLE  SENSORY:   normal and symmetric  to light touch, temperature, vibration  COORDINATION:   finger-nose-finger, fine finger movements normal  REFLEXES:   deep tendon reflexes present and symmetric  GAIT/STATION:   narrow based gait     DIAGNOSTIC DATA (LABS, IMAGING, TESTING) - I reviewed patient records, labs, notes, testing and imaging myself where available.  No results found for: WBC, HGB, HCT, MCV, PLT    Component Value Date/Time   NA 138 10/26/2017 1916   K 3.8 10/26/2017 1916   CL 101 10/26/2017 1916   CO2 26 10/26/2017 1916   GLUCOSE 89 10/26/2017 1916   BUN 11 10/26/2017 1916   CREATININE 0.81 10/26/2017 1916   CALCIUM 9.0 10/26/2017 1916   GFRNONAA >60 10/26/2017 1916   GFRAA >60 10/26/2017  1916   No results found for: CHOL, HDL, LDLCALC, LDLDIRECT, TRIG, CHOLHDL No results found for: KGMW1U No results found for: VITAMINB12 No results found for: TSH     ASSESSMENT AND PLAN  49 y.o. year old female here with mild cognitive complaints and issues, some of which have been lifelong and some of which have increased in the last 2 to 3 years.  Dx:  1. Cognitive complaints      PLAN:  - check MRI brain and neuropsychology testing to rule out other secondary causes - daily physical activity / exercise (at least 15-30 minutes) - eat more plants / vegetables - increase social activities, brain stimulation, games, puzzles, hobbies, crafts, arts, music - aim for at least 7-8 hours sleep per night (or more)  Orders Placed This Encounter  Procedures  . MR BRAIN W WO CONTRAST  . Ambulatory referral to Neuropsychology   Return for pending if symptoms worsen or fail to improve.    Suanne Marker, MD 04/14/2020, 4:32 PM Certified in Neurology, Neurophysiology and Neuroimaging  [facility] [address] [place], [place] [address] [phone]

## 2020-04-15 ENCOUNTER — Telehealth: Payer: Self-pay | Admitting: Diagnostic Neuroimaging

## 2020-04-15 ENCOUNTER — Encounter: Payer: Self-pay | Admitting: Counselor

## 2020-04-15 NOTE — Telephone Encounter (Signed)
LVM for pt to call back about scheduling mri  [employer] auth: [id number] (exp. 04/15/20 to 10/11/20)

## 2020-06-09 ENCOUNTER — Encounter: Payer: Self-pay | Admitting: Counselor

## 2020-06-09 ENCOUNTER — Ambulatory Visit: Payer: BC Managed Care – PPO | Admitting: Counselor

## 2020-06-09 ENCOUNTER — Other Ambulatory Visit: Payer: Self-pay

## 2020-06-09 ENCOUNTER — Ambulatory Visit: Payer: BC Managed Care – PPO

## 2020-06-09 DIAGNOSIS — F819 Developmental disorder of scholastic skills, unspecified: Secondary | ICD-10-CM | POA: Diagnosis not present

## 2020-06-09 DIAGNOSIS — R4189 Other symptoms and signs involving cognitive functions and awareness: Secondary | ICD-10-CM

## 2020-06-09 DIAGNOSIS — F09 Unspecified mental disorder due to known physiological condition: Secondary | ICD-10-CM

## 2020-06-09 NOTE — Progress Notes (Signed)
   Psychometrist Note   Cognitive testing was administered to Michelle Frye by Lamar Benes, B.S. (Technician) under the supervision of Alphonzo Severance, Psy.D., ABN. Michelle Frye was able to tolerate all test procedures. Dr. Nicole Kindred met with the patient as needed to manage any emotional reactions to the testing procedures. Rest breaks were offered.    The battery of tests administered was selected by Dr. Nicole Kindred with consideration to the patient's current level of functioning, the nature of her symptoms, emotional and behavioral responses during the interview, level of literacy, observed level of motivation/effort, and the nature of the referral question. This battery was communicated to the psychometrist. Communication between Dr. Nicole Kindred and the psychometrist was ongoing throughout the evaluation and Dr. Nicole Kindred was immediately accessible at all times. Dr. Nicole Kindred provided supervision to the technician on the date of this service, to the extent necessary to assure the quality of all services provided.    Michelle Frye will return in approximately one week for an interactive feedback session with Dr. Nicole Kindred, at which time test performance, clinical impressions, and treatment recommendations will be reviewed in detail. The patient understands she can contact our office should she require our assistance before this time.   A total of 150 minutes of billable time were spent with Michelle Frye by the technician, including test administration and scoring time. Billing for these services is reflected in Dr. Les Pou note.   This note reflects time spent with the psychometrician and does not include test scores, clinical history, or any interpretations made by Dr. Nicole Kindred. The full report will follow in a separate note.

## 2020-06-09 NOTE — Progress Notes (Signed)
NEUROPSYCHOLOGICAL EVALUATION [facility]  Patient Name: Michelle Frye MRN: [id number] Date of Birth: 09-26-1970 Age: 50 y.o. Education: 16 years  Referral Circumstances and Background Information  Michelle Frye is a 50 y.o., right-hand dominant, single woman with a history of GERD, PCOS, HTN, and ill-specified learning disorder. She was referred by Dr. Marjory Lies at [facility] who is working her up for cognitive changes. As per Dr. Richrd Humbles previous documentation, she has a longstanding history of mild memory issues, difficulties spelling, and slow reading but is concerning about worsening in these abilities over the past 2-3 years. She was referred for diagnostic input and to rule out any secondary causes.   On interview, the patient reported that she has a longstanding history of difficulties with reading (particularly reading out loud) since elementary school and reversing numbers and letters. More recently, she feels like she has been having mild cognitive problems that are similar but are increased relative to her experience in the past. In terms of her day-to-day complaints, she will sometimes jump ahead an hour when she reads the time, she will commit semantic paraphasias (e.g., saying "red" instead of "blue"), and she feels like her reading may be a bit slower than in the past (such as when reading road signs). She notices this mainly at school, particularly when there is a lot going on. She almost always catches her errors. Her best friend has commented on this and will frequently correct her. She pointed out the patient's problems, then the patient took note of them, and she also suggested that she get them checked out. These difficulties have been going on for 2 years at the longest although she isn't really sure when they specifically started. She had some additional complaints in terms of her spelling getting worse over the past 3-4 years. She also is a slow reader but  she's not sure it's worse than in the past, maybe a bit. On specific review of symptoms, she thinks her memory isn't as good as it was but she is not repeating herself and no one is commenting on memory problems, she doesn't have problems with word finding, she doesn't have a hard time with language comprehension, and she has no problems making speech sounds. There are no visual or auditory hallucinations or mistaken beliefs. From a sensory perspective, she has some slight amount of low tone hearing loss and her ENT was considering meniere's disease, although it sounds like that was never proven. She has intermittent tinnitus, but not often, she had some episodic dizziness this summer but it was momentary.   With respect to mood, the patient reported that she is a pretty happy person. 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 Her energy is good, she is tired at the end of the day but she works hard. She is sleeping well, typically around 7.5 - 8 hours a night. She has no dream enactment behavior. Her appetite is good. She has gained 30 lbs, she thinks her metabolism is "tanking" as she ages. She said that she tends to eat high calorie things. She is trying to go to the gym and has a history of sporadic exercise, but has not been going for the past several weeks. She was a Publishing copy in college and thinks it "burnt her out" on exercise. The patient has several hobbies, she has a good social support network and will do things with  her friends, she enjoys painting, decorating her house, etc. 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   With respect to functioning, the patient has no issues, and is still independent with everything. She is paying her bills on time, is driving without incident, is managing her own medication appointments, finances, and the like. She enjoys cooking and is able to cook for herself.   Past  Medical History and Review of Relevant Studies   Patient Active Problem List   Diagnosis Date Noted  . History of bilateral breast reduction surgery 02/12/2019  . Cardiac murmur 10/24/2017  . Preoperative clearance 10/24/2017   Review of Neuroimaging and Relevant Medical History: The patient has no history of strokes, seizures, head injuries, or neurological surgeries.  She does have a history of migraines that she thinks are hormonal (sync with her cycle) although those have decreased markedly over the years. She has only had two in the last year if that.   She has acid reflux and has been on various medications for about a year.   MRI has been ordered by Dr. Marjory Lies but has not yet been obtained.  Current Outpatient Medications  Medication Sig Dispense Refill  . esomeprazole (NEXIUM) 20 MG capsule Take 20 mg by mouth daily at 12 noon.    . ferrous sulfate 324 MG TBEC Take 324 mg by mouth every other day.    . fexofenadine (ALLEGRA) 180 MG tablet Take 180 mg by mouth daily.    Marland Kitchen losartan-hydrochlorothiazide (HYZAAR) 50-12.5 MG tablet Take 1 tablet by mouth daily.    . metFORMIN (GLUCOPHAGE) 500 MG tablet Take 500 mg by mouth 3 (three) times daily.     . Multiple Vitamin (MULTIVITAMIN WITH MINERALS) TABS tablet Take 1 tablet by mouth daily.    . pantoprazole (PROTONIX) 40 MG tablet Take 40 mg by mouth 2 (two) times daily.    Marland Kitchen PREVIFEM 0.25-35 MG-MCG tablet Take 1 tablet by mouth daily.    Marland Kitchen spironolactone (ALDACTONE) 50 MG tablet Take 50 mg by mouth daily.     No current facility-administered medications for this visit.   Family History  Problem Relation Age of Onset  . Heart disease Mother   . Heart disease Father    There is no  family history of dementia. Her paternal grandmother also has a history of depression. There is a family history of learning difficulties. It took her brother until his freshman year of college to read on grade level. Her mother also has difficulties  spelling and reading things. She wasn't aware of any other family members, although numerous cousins struggled with school.   Psychosocial History  Developmental, Educational and Employment History: The patient is a native of [place], near [place]. She reported a decent childhood and denied any abuse or neglect. The patient has a history of learning problems, she would always have a very difficult time with oral reading that came to attention in 1st grade and she was in some remedial classes. They realized, however, that her comprehension was adequate, it was only reading out loud, and those services stopped by second grade. She also struggled in math but was able to get through. She was still able to earn decent grades in all of her classes. She earned mainly A's and B's, some C's. She went on to complete an undergraduate degree in art education from [employer]. She was on the Dean's list and generally did well. 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   Psychiatric History: The patient has a limited history of involvement in counseling but no significant mental health history.   Substance Use History: The patient does not drink alcohol, she doesn't smoke, she doesn't use illicit drugs.   Relationship History and Living Cimcumstances: The patient is single and has never been married. She has no children.   Mental Status and Behavioral Observations  Sensorium/Arousal: The patient's level of arousal was awake and alert. Hearing and vision were adequate for testing purposes. Orientation: The patient was alert and fully oriented to person, place, time, and situation.  Appearance: Dressed in appropriate, casual clothing.  Behavior: The patient was pleasant, appropriate, and presented as energetic and cooperative throughout the evaluation.  Speech/language: Speech was normal in rate, rhythm,  volume, and prosody. There were no paraphasic errors in spontaneous speech. There were no speech praxis problems. There were no word finding problems.  Gait/Posture: Gait appeared normal on observation on ambulation within the clinic Movement: No overt signs/symptoms of abnormal movements Social Comportment: Patient presented as socially facile and was appropriate within social norms.  Mood: "Good" Affect: Bright, mainly euthymic Thought process/content: The patient's thoughts were logical, linear, and goal-oriented. She has no difficulties presenting a fairly detailed personal history and timeline. Thought content was appropriate to the evaluative context.   Safety: No safety concerns identified in this euthymic patient Insight: Christin Bach Cognitive Assessment  06/09/2020  Visuospatial/ Executive (0/5) 5  Naming (0/3) 3  Attention: Read list of digits (0/2) 2  Attention: Read list of letters (0/1) 1  Attention: Serial 7 subtraction starting at 100 (0/3) 3  Language: Repeat phrase (0/2) 1  Language : Fluency (0/1) 1  Abstraction (0/2) 2  Delayed Recall (0/5) 5  Orientation (0/6) 6  Total 29  Adjusted Score (based on education) 29   Test Procedures  Wide Range Achievement Test - 4             Word Reading  Spelling Wechsler Adult Intelligence Scale - IV Neuropsychological Assessment Battery  Memory Module The Dot Counting Test ACS Word Choice Controlled Oral Word Association (F-A-S) Semantic Fluency (Animals) Trail Making Test A & B Modified Wisconsin Card Sorting Test Patient Health Questionnaire-9 GAD-7  Plan  Keisy Strickler was seen for a psychiatric diagnostic evaluation and neuropsychological testing. She is a pleasant, 50 year old, right-hand dominant woman with a history of likely learning disorder and increased difficulties resembling what might be expected in the setting of a learning disorder over the past several years. My index of suspicion is low for any kind of  progressive condition, but we will try to better characterize her learning issues and rule out anything more sinister. Full and complete note with impressions, recommendations, and interpretation of test data to follow.   Bettye Boeck Roseanne Reno, PsyD, ABN Clinical Neuropsychologist  Informed Consent and Coding/Compliance  Risks and benefits of the evaluation were discussed with the patient prior to all testing procedures. I conducted a clinical interview and neuropsychological testing (at least two tests) with Clois Dupes and Clare Charon, B.S. (Technician) administered additional test procedures. The patient was able to tolerate the testing procedures and the patient (and/or family if applicable) is likely to benefit from further follow up to receive the diagnosis and treatment recommendations, which will be rendered at the next encounter. Billing below reflects technician time, my direct face-to-face time with the patient, time spent in test administration,  and time spent in professional activities including but not limited to: neuropsychological test interpretation, integration of neuropsychological test data with clinical history, report preparation, treatment planning, care coordination, and review of diagnostically pertinent medical history or studies.   Services associated with this encounter: Clinical Interview [phone]) plus 60 minutes (41638; Neuropsychological Evaluation by Professional)  95 minutes (45364; Neuropsychological Evaluation by Professional, Adl.) 16 minutes (68032; Test Administration by Professional) 30 minutes (12248; Neuropsychological Testing by Technician) 120 minutes (25003; Neuropsychological Testing by Technician, Adl.)

## 2020-06-09 NOTE — Progress Notes (Signed)
NEUROPSYCHOLOGICAL TEST SCORES [facility]  Patient Name: Michelle Frye MRN: [id number] Date of Birth: 1970-07-07 Age: 50 y.o. Education: 16 years  Measurement properties of test scores: IQ, Index, and Standard Scores (SS): Mean = 100; Standard Deviation = 15 Scaled Scores (Ss): Mean = 10; Standard Deviation = 3 Z scores (Z): Mean = 0; Standard Deviation = 1 T scores (T); Mean = 50; Standard Deviation = 10  TEST SCORES:    Note: This summary of test scores accompanies the interpretive report and should not be interpreted by unqualified individuals or in isolation without reference to the report. Test scores are relative to age, gender, and educational history as available and appropriate.   Performance Validity        ACS: Raw  Descriptor      Word Choice 50 Within Expectation       Raw  Descriptor  The Dot Counting Test: 14 Within Expectation      Embedded Measures: Raw  Descriptor      WAIS-IV Reliable Digit Span 6 Below Expectation      WAIS-IV Reliable Digit Span Revised 10 Below Expectation      Intellectual/Achievement Testing        Wide Range Achievement Test: Standard/Scaled Score Percentile      Word Reading 80 9      Spelling 79 8      Wechsler Adult Intelligence Scale - IV Standard/Scaled Score Percentile  Full Scale IQ 104 61  General Ability Index 112 79  Verbal Comprehension Index 114 82      Vocabulary 13 84      Similarities 12 75      Information 13 84  Perceptual Reasoning Index 107 68      Block Design 11 63      Matrix Reasoning 14 91      Visual Puzzles 9 37  Working Memory Index 77 6      Digit Span 5 5          Digit Span Forward 6 9          Digit Span Backward 6 9          Digit Span Sequencing 7 16      Arithmetic 7 16  Processing Speed Index 108 70      Symbol Search 12 75      Coding 11 63      Cognitive Testing        Verbal Fluency:  T Score Percentile      Controlled Oral Word Association (F-A-S) 36 8      Semantic  Fluency (Animals) 43 25      Neuropsychological Assessment Battery (Memory Module, Form 1): T-score/Standard Score Percentile  Memory Index (MEM): 86 18      List Learning           List A Immediate Recall   (5 , 9 , 10) 41 18         List B Immediate Recall   (6) 50 50         List A Short Delayed Recall   (9) 47 38         List A Long Delayed Recall   (8) 43 25         List A Percent Retention   (89 %) --- 31         List A Long Delayed Yes/No Recognition Hits   (12) --- 79  List A Long Delayed Yes/No Recognition False Alarms   (1) --- 69         List A Recognition Discriminability Index --- 75      Shape Learning           Immediate Recognition   (6 , 7 , 8) 55 69         Delayed Recognition   (7) 51 54         Percent Retention   (88 %) --- 31         Delayed Forced-Choice Recognition Hits   (9) --- 84         Delayed Forced-Choice Recognition False Alarms   (0) --- 69         Delayed Forced-Choice Recognition Discriminability --- 84      Story Learning           Immediate Recall   (27, 33) 40 16         Delayed Recall   (31) 39 14         Percent Retention   (94 %) --- 46      Daily Living Memory            Immediate Recall   (23, 17) 36 8          Delayed Recall   (8, 7) 43 25          Percent Retention (88 %) --- 34          Recognition Hits   (9) --- 38      Modified Wisconsin Card Sorting Test (MWCST): Standard/T-Score Percentile      Number of Categories Correct 51 54      Number of Perseverative Errors 40 16      Number of Total Errors 35 7      Percent Perseverative Errors 46 34  Executive Function Composite 93 32      Trail Making Test: T-Score Percentile      Part A 56 73      Part B 34 5      Clock Drawing Raw Score Descriptor      Command 10 WNL      Rating Scales         Raw Score Descriptor  Patient Health Questionnaire - 9 0 Within Normal Limits  GAD-7 4 Within Normal Limits   Shylynn Bruning V. Roseanne Reno PsyD, ABN Clinical Neuropsychologist

## 2020-06-09 NOTE — Progress Notes (Signed)
NEUROPSYCHOLOGICAL EVALUATION [facility]  Patient Name: Michelle Frye MRN: [id number] Date of Birth: 1971-02-24 Age: 50 y.o. Education: 16 years  Clinical Impressions  Michelle Frye is a 50 y.o., right-hand dominant, single woman with a history of learning disability and concerns about worsening of her preexisting issues since then. She additionally appreciates some problems with short term memory. Her day-to-day complaints include minor cognitive errors, such as semantic paraphasias, adding an hour when she tells the time, and reversing numbers and letters. She also feels as though she is slower at reading than at the past (although she has always been a slow reader). 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 She has no affective complaints and reported that she is generally in a good mood and that she has good social support.   Neuropsychological test findings show diminished reading and spelling abilities relative to the patient's measured IQ level. There is also some clinically relevant variability amongst constituent cognitive skills, with unusually low working memory and some scattered low scores on measures of executive ability. Her overall primary memory ability is low average, which while not concerning for any frank impairment may be a bit lower than expected given her measured intelligence. The profile shows mainly weak encoding, which is most likely on the basis of processing efficiency and executive control factors. She screened in the negative range for depression and anxiety symptoms.   Ms. Michelle Frye is thus manifesting findings that in the context of her clinical history do substantiate the impression of a reading/spelling disorder resembling dyslexia. These difficulties are often accompanied by other cognitive weaknesses, which in her case involve cognitive  efficiency and executive control (including working memory abilities) and may be attenuating her memory function. Barring any surprises on her MRI, my sense is that aging and increased focus on her problems in the setting of preexisting learning disabilities is responsible for her day-to-day complaints. I am not concerned about an underlying degenerative condition or progressive etiology. She may benefit from SLP/cognitive rehab for remediation.   Diagnostic Impressions: Learning Disorder  Recommendations to be discussed with patient  Your performance and presentation were consistent with longstanding issues that in the context of your clinical history are suggestive of a learning disorder. As you already know, your reading ability is below what we would expect for an individual of your ability level and you have a history of the same in school. These types of conditions are often accompanied by relative patterns of cognitive strengths and weaknesses, and you appear to have a relative strength in working memory (I.e., ability to hold information online and do something with it). This may be attenuating your memory function, because encoding processes depend heavily on working memory abilities.   As for your perception of decline, my guess is that you may be recognizing the effects of normal aging in the setting of a preexisting condition. Various conditions including ADHD, mental illness, and the like do not typically result in a dementia level problem throughout aging but they may influence the way your brain ages, such that it ages differently than a so-called "normal" brain. This can lead to increased perception of cognitive problems as you age. Another relevant concept is that of cognitive reserve. We all start out with a certain amount of "cognitive reserve," or resilience. You may have less reserve in some areas as a result of your history of learning  problems and so as age-related changes occur, they  may be causing more difficulties.  Importantly, do not overestimate the power of overfocus on cognitive. Once you begin to notice cognitive problems and to pay attention to cognitive errors, you are no doubt likely to find more, because we all forget things, say the wrong word, and have cognitive difficulties every day. This becomes the cause of more concern, which causes increased focus, thereby engendering negative emotions like anxiety that adversely impact your ability to perform. This is a vicious cycle. In your case, your friend commenting on your issues and telling you to get them checked out may have contributed to your concern.   I would like to reassure you that I see no indications of an incipient degenerative condition, like Alzheimer's disease. You do not have memory storage problems or the other types of problems I expect to see in Alzheimer's and you are also young.   My best suggestion would be for you to relax, cultivate an observers mind, practice self-compassion, and dedicate yourself fully to the task at hand. This will help maximize your performance. Realize that some cognitive errors are not necessarily the start of a drastic decline.   If you are interested in treatment, you might consult with a speech language pathologist for cognitive rehabilitation. This is a professional who could work with you in a therapeutic capacity to help you learn how to utilize strengths to compensate for weaknesses and to work around your difficulties more effectively. If you prefer self-directed rehabilitation, you might consider something like http://www.perez.com/, which probably has the best support of any "brain training" website.    Test Findings  Test scores are summarized in additional documentation associated with this encounter. Test scores are relative to age, gender, and educational history as available and appropriate. There were no concerns about performance validity despite two embedded  indicators falling below the level of expectations. This likely reflects her working memory difficulties as she performed WNL on two different standalone measures.  General Intellectual Functioning/Achievement:  Performance on single word and spelling clustered around the bottom of the low average/unusually low ranges, which are weak performances substantiating her report of reading difficulties in school. By contrast, she performed at an average level with respect to overall cognitive abilities. Performance on verbal abilities, perceptual reasoning abilities, and processing speed abilities was near the top of the average to the bottom of the high average ranges. Working memory abilities were somewhat lower, in the unusually low range.   Attention and Processing Efficiency: Performance on indicators of working memory and attention was weak with an unusually low score on the WAIS-IV Working Memory Index. Digit repetition forward and backward were unusually low. Digit resequencing in ascending order was low average. Performance when solving mental arithmetic problems without paper and pencil was low average.   With respect to processing speed, performance was toward the upper aspect of the average range on the WAIS-IV Processing Speed Index. Comparable strong average range scores were demonstrated on a measure of efficient visual matching and efficient visual scanning, as well as a measure of timed number-symbol coding.   Language: Language findings showed strong abilities with an overall high average score on the Verbal Comprehension Index of the WAIS-IV. Vocabulary knowledge and fund of information were high average. Identifying similarities between similarly dissimilar objects and concepts was average.   Visuospatial Function: Performance was good on visuospatial and constructional measures. Colored block assembly was average and solving a series of increasingly difficult visual  puzzles (a measure of  visual analysis, synthesis, and ability to mentally manipulate visual information) was average. She performed at a superior level when solving increasingly difficult matrix problems.   Learning and Memory: Performance on measures of learning and memory was low average; however, this may be just a bit less than expected for an individual of her overall ability. The profile shows marginal encoding on verbal measures and generally good retention of information across time and is thus unconcerning for a true storage problem.   In the verbal realm, immediate recall for a 12-item word list and short story were low average followed by comparable low average delayed recall. Yes/no discriminability for words from the list versus foils was average. Memory for brief daily living type information was unusually low on immediate recall but retention across time was good with low average delayed recall. Recognition memory was average for the information contained amongst distractor alternate items.   In the visual realm, immediate recognition for a series of designs that are difficult to visually encode was average followed by average delayed recognition. Yes/no discriminability for designs versus false choices was high average.   Executive Functions: Performance on measures of executive abilities was weak, with scattered low scores suggestive of some minor difficulties. She demonstrated an average score on the Modified [employer] Function Composite but did have an unusually low errors score. Her perseverative errors score was low average, meaning that she did not approach the task in a rigid fashion. Alternating sequencing of numbers and letters of the alphabet was unusually low. Generation of words in response to given letters was unusually low.   Rating Scale(s): Ms. Noxon screened negative for the presence of depression on self-rating. She screened in the minimal range for anxiety  symptoms.   Bettye Boeck Roseanne Reno PsyD, ABN Clinical Neuropsychologist

## 2020-06-17 ENCOUNTER — Ambulatory Visit (INDEPENDENT_AMBULATORY_CARE_PROVIDER_SITE_OTHER): Payer: BC Managed Care – PPO | Admitting: Counselor

## 2020-06-17 ENCOUNTER — Encounter: Payer: BC Managed Care – PPO | Admitting: Counselor

## 2020-06-17 ENCOUNTER — Other Ambulatory Visit: Payer: Self-pay

## 2020-06-17 DIAGNOSIS — F819 Developmental disorder of scholastic skills, unspecified: Secondary | ICD-10-CM

## 2020-06-17 NOTE — Patient Instructions (Signed)
Your performance and presentation were consistent with longstanding issues that in the context of your clinical history are suggestive of a learning disorder. As you already know, your reading ability is below what we would expect for an individual of your ability level and you have a history of the same in school. These types of conditions are often accompanied by relative patterns of cognitive strengths and weaknesses, and you appear to have a relative strength in working memory (I.e., ability to hold information online and do something with it). This may be attenuating your memory function, because encoding processes depend heavily on working memory abilities.   As for your perception of decline, my guess is that you may be recognizing the effects of normal aging in the setting of a preexisting condition. Various conditions including ADHD, mental illness, and the like do not typically result in a dementia level problem throughout aging but they may influence the way your brain ages, such that it ages differently than a so-called "normal" brain. This can lead to increased perception of cognitive problems as you age. Another relevant concept is that of cognitive reserve. We all start out with a certain amount of "cognitive reserve," or resilience. You may have less reserve in some areas as a result of your history of learning problems and so as age-related changes occur, they may be causing more difficulties.  Importantly, do not overestimate the power of overfocus on cognitive. Once you begin to notice cognitive problems and to pay attention to cognitive errors, you are no doubt likely to find more, because we all forget things, say the wrong word, and have cognitive difficulties every day. This becomes the cause of more concern, which causes increased focus, thereby engendering negative emotions like anxiety that adversely impact your ability to perform. This is a vicious cycle. In your case, your friend  commenting on your issues and telling you to get them checked out may have contributed to your concern.   I would like to reassure you that I see no indications of an incipient degenerative condition, like Alzheimer's disease. You do not have memory storage problems or the other types of problems I expect to see in Alzheimer's and you are also young.   My best suggestion would be for you to relax, cultivate an observers mind, practice self-compassion, and dedicate yourself fully to the task at hand. This will help maximize your performance. Realize that some cognitive errors are not necessarily the start of a drastic decline.   If you are interested in treatment, you might consult with a speech language pathologist for cognitive rehabilitation. This is a professional who could work with you in a therapeutic capacity to help you learn how to utilize strengths to compensate for weaknesses and to work around your difficulties more effectively. If you prefer self-directed rehabilitation, you might consider something like http://www.perez.com/, which probably has the best support of any "brain training" website.

## 2020-06-18 ENCOUNTER — Encounter: Payer: Self-pay | Admitting: Counselor

## 2020-06-18 NOTE — Progress Notes (Signed)
   NEUROPSYCHOLOGY FEEDBACK NOTE [facility]  Feedback Note: I met with Michelle Frye to review the findings resulting from her neuropsychological evaluation. Since the last appointment, she has been about the same. Time was spent reviewing the impressions and recommendations that are detailed in the evaluation report. We discussed impression of learning disorder resembling dyslexia with associated cognitive difficulties, as reflected in the patient instructions. She was interested in treatment, although there are limited resources for treatment of adult LD. I recommended that she consult with speech language pathology and was also candid that this is not my area of specific expertise. I took time to explain the findings and answer all the patient's questions. I encouraged Michelle Frye to contact me should she have any further questions or if further follow up is desired.   Current Medications and Medical History   Current Outpatient Medications  Medication Sig Dispense Refill  . esomeprazole (NEXIUM) 20 MG capsule Take 20 mg by mouth daily at 12 noon.    . ferrous sulfate 324 MG TBEC Take 324 mg by mouth every other day.    . fexofenadine (ALLEGRA) 180 MG tablet Take 180 mg by mouth daily.    Marland Kitchen losartan-hydrochlorothiazide (HYZAAR) 50-12.5 MG tablet Take 1 tablet by mouth daily.    . metFORMIN (GLUCOPHAGE) 500 MG tablet Take 500 mg by mouth 3 (three) times daily.     . Multiple Vitamin (MULTIVITAMIN WITH MINERALS) TABS tablet Take 1 tablet by mouth daily.    . pantoprazole (PROTONIX) 40 MG tablet Take 40 mg by mouth 2 (two) times daily.    Marland Kitchen PREVIFEM 0.25-35 MG-MCG tablet Take 1 tablet by mouth daily.    Marland Kitchen spironolactone (ALDACTONE) 50 MG tablet Take 50 mg by mouth daily.     No current facility-administered medications for this visit.    Patient Active Problem List   Diagnosis Date Noted  . History of bilateral breast reduction surgery 02/12/2019  . Cardiac murmur 10/24/2017  .  Preoperative clearance 10/24/2017    Mental Status and Behavioral Observations  Michelle Frye presented on time to the present encounter and was alert and generally oriented. Speech was normal in rate, rhythm, volume, and prosody. Self-reported mood was "good" and affect was euthymic. Thought process was logical and goal oriented and thought content was appropriate to the topics discussed. There were no safety concerns identified at today's encounter, such as thoughts of harming self or others.   Plan  Feedback provided regarding the patient's neuropsychological evaluation. I reassured her that there are no findings concerning for dementia, overall she seems to be doing fairly well, her developmental issues notwithstanding. Michelle Frye was encouraged to contact me if any questions arise or if further follow up is desired.   Michelle Frye Michelle Kindred, PsyD, ABN Clinical Neuropsychologist  Service(s) Provided at This Encounter: 32 minutes [phone]; Psychotherapy with patient/family)

## 2021-11-25 NOTE — Telephone Encounter (Signed)
error 

## 2023-07-31 ENCOUNTER — Ambulatory Visit (INDEPENDENT_AMBULATORY_CARE_PROVIDER_SITE_OTHER): Payer: BC Managed Care – PPO

## 2023-07-31 ENCOUNTER — Ambulatory Visit (INDEPENDENT_AMBULATORY_CARE_PROVIDER_SITE_OTHER)

## 2023-07-31 VITALS — BP 124/80 | HR 86 | Ht 64.0 in | Wt 234.0 lb

## 2023-07-31 DIAGNOSIS — R0981 Nasal congestion: Secondary | ICD-10-CM

## 2023-07-31 DIAGNOSIS — H6983 Other specified disorders of Eustachian tube, bilateral: Secondary | ICD-10-CM

## 2023-07-31 DIAGNOSIS — J310 Chronic rhinitis: Secondary | ICD-10-CM

## 2023-07-31 DIAGNOSIS — H903 Sensorineural hearing loss, bilateral: Secondary | ICD-10-CM | POA: Diagnosis not present

## 2023-07-31 DIAGNOSIS — R42 Dizziness and giddiness: Secondary | ICD-10-CM

## 2023-07-31 DIAGNOSIS — J343 Hypertrophy of nasal turbinates: Secondary | ICD-10-CM

## 2023-07-31 DIAGNOSIS — J342 Deviated nasal septum: Secondary | ICD-10-CM

## 2023-07-31 NOTE — Progress Notes (Unsigned)
 Patient ID: Michelle Frye, female   DOB: 1971-03-28, 53 y.o.   MRN: [id number]  Follow-up: Chronic nasal obstruction, recurrent dizziness, hearing loss, eustachian tube dysfunction  HPI: The patient is a 53 year old female who returns today for her follow-up evaluation.  The patient was previously seen for multiple medical issues, including recurrent dizziness, hearing loss, eustachian tube dysfunction, and chronic nasal obstruction.  She was diagnosed with right ear Mnire's disease, bilateral low-frequency sensorineural hearing loss, clinical eustachian tube dysfunction, nasal septal deviation, and bilateral inferior turbinate hypertrophy.  She was treated with a low-salt diet, Flonase, Valsalva exercise, and surgical intervention with septoplasty and turbinate reduction.  According to the patient, her breathing has improved since her surgery.  She continues to have occasional off-balance and dizziness sensation.  She denies any significant spinning vertigo.  Over the last 2 weeks, she has noted slight increase in her nasal congestion.  Currently she denies any change in her hearing, facial pain, fever, or visual change.  Exam: General: Communicates without difficulty, well nourished, no acute distress. Head: Normocephalic, no evidence injury, no tenderness, facial buttresses intact without stepoff. Face/sinus: No tenderness to palpation and percussion. Facial movement is normal and symmetric. Eyes: PERRL, EOMI. No scleral icterus, conjunctivae clear. Neuro: CN II exam reveals vision grossly intact.  No nystagmus at any point of gaze. Ears: Auricles well formed without lesions.  Ear canals are intact without mass or lesion.  No erythema or edema is appreciated.  The TMs are intact without fluid. Nose: External evaluation reveals normal support and skin without lesions.  Dorsum is intact.  Anterior rhinoscopy reveals congested mucosa over anterior aspect of inferior turbinates and intact septum.  No purulence  noted. Oral:  Oral cavity and oropharynx are intact, symmetric, without erythema or edema.  Mucosa is moist without lesions. Neck: Full range of motion without pain.  There is no significant lymphadenopathy.  No masses palpable.  Thyroid bed within normal limits to palpation.  Parotid glands and submandibular glands equal bilaterally without mass.  Trachea is midline. Neuro:  CN 2-12 grossly intact. Gait wide-based. Vestibular: No nystagmus at any point of gaze. Vestibular: There is no nystagmus with pneumatic pressure on either tympanic membrane or Valsalva. The cerebellar examination is unremarkable.   Assessment:  1.  The patient's dizziness and right ear Mnire's disease are currently stable.  She has not had any significant vertigo over the past few months.  2.  The patient's ear canals, tympanic membranes, and middle ear spaces are normal. 3.  Subjectively stable bilateral low-frequency sensorineural hearing loss. 4.  Chronic rhinitis with nasal mucosal congestion.  Her septum and turbinates are well-healed.  Her nasal passageways are patent.  Plan: 1.  The physical exam findings are reviewed with the patient.  The hearing test results are reviewed. 2.  Continue with a 1500 mg low-salt diet.  The pathophysiology of Mnire's disease is discussed with the patient. 3.  Flonase and Allegra as needed. 4.  The patient is encouraged to perform nasal saline irrigation.   5.  The patient will return for reevaluation in 6 months.

## 2023-08-02 DIAGNOSIS — J310 Chronic rhinitis: Secondary | ICD-10-CM | POA: Insufficient documentation

## 2023-08-02 DIAGNOSIS — J342 Deviated nasal septum: Secondary | ICD-10-CM | POA: Insufficient documentation

## 2023-08-02 DIAGNOSIS — H903 Sensorineural hearing loss, bilateral: Secondary | ICD-10-CM | POA: Insufficient documentation

## 2023-08-02 DIAGNOSIS — H6983 Other specified disorders of Eustachian tube, bilateral: Secondary | ICD-10-CM | POA: Insufficient documentation

## 2023-08-02 DIAGNOSIS — R42 Dizziness and giddiness: Secondary | ICD-10-CM | POA: Insufficient documentation

## 2023-08-02 DIAGNOSIS — J343 Hypertrophy of nasal turbinates: Secondary | ICD-10-CM | POA: Insufficient documentation

## 2024-02-07 ENCOUNTER — Ambulatory Visit (INDEPENDENT_AMBULATORY_CARE_PROVIDER_SITE_OTHER): Admitting: Otolaryngology

## 2024-02-29 ENCOUNTER — Encounter (INDEPENDENT_AMBULATORY_CARE_PROVIDER_SITE_OTHER): Payer: Self-pay | Admitting: Otolaryngology

## 2024-02-29 ENCOUNTER — Ambulatory Visit (INDEPENDENT_AMBULATORY_CARE_PROVIDER_SITE_OTHER): Admitting: Otolaryngology

## 2024-02-29 VITALS — BP 116/78 | HR 90 | Temp 98.4°F

## 2024-02-29 DIAGNOSIS — R0981 Nasal congestion: Secondary | ICD-10-CM | POA: Diagnosis not present

## 2024-02-29 DIAGNOSIS — H9191 Unspecified hearing loss, right ear: Secondary | ICD-10-CM

## 2024-02-29 DIAGNOSIS — J310 Chronic rhinitis: Secondary | ICD-10-CM | POA: Diagnosis not present

## 2024-02-29 DIAGNOSIS — H9391 Unspecified disorder of right ear: Secondary | ICD-10-CM | POA: Diagnosis not present

## 2024-02-29 DIAGNOSIS — J343 Hypertrophy of nasal turbinates: Secondary | ICD-10-CM

## 2024-02-29 DIAGNOSIS — H8101 Meniere's disease, right ear: Secondary | ICD-10-CM

## 2024-02-29 DIAGNOSIS — H903 Sensorineural hearing loss, bilateral: Secondary | ICD-10-CM

## 2024-02-29 DIAGNOSIS — R42 Dizziness and giddiness: Secondary | ICD-10-CM

## 2024-02-29 DIAGNOSIS — H6983 Other specified disorders of Eustachian tube, bilateral: Secondary | ICD-10-CM

## 2024-02-29 NOTE — Progress Notes (Signed)
 Patient ID: Michelle Frye, female   DOB: Nov 07, 1970, 53 y.o.   MRN: [id number]  Follow-up: Chronic nasal obstruction, recurrent dizziness, hearing loss, eustachian tube dysfunction   Discussed the use of AI scribe software for clinical note transcription with the patient, who gave verbal consent to proceed.  History of Present Illness Michelle Frye is a 53 year old female with Meniere's disease who presents with worsening hearing loss, dizziness, and nasal congestion.  Over the past six months, she has experienced increased hearing loss in her right ear, accompanied by pressure, tingling, and muffled hearing. She also reports mild dizziness, described as a sensation of being 'on a ship' and feeling 'off balance,' but not spinning. An incident occurred where she had to descend from a ladder due to dizziness, which resolved after sitting for about twenty minutes.  She also reports fluctuating right ear hearing loss.  She reports reducing her salt intake, though she does not adhere to a strict 1500 mg low-salt diet. Salty foods exacerbate her symptoms, with effects noticeable a few hours post-consumption. She has not had severe spinning episodes recently.  She has a history of fall allergies and previously used Flonase, which was ineffective. Currently, she takes a generic form of Allegra orally, as the nasal spray did not provide improvement.  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 She recently traveled to [place], managing her dietary restrictions by consuming fresh foods and avoiding processed and salty foods.  Exam: General: Communicates without difficulty, well nourished, no acute distress. Head: Normocephalic, no evidence injury, no tenderness, facial buttresses intact without stepoff. Face/sinus: No tenderness to palpation and percussion. Facial movement is normal and symmetric. Eyes: PERRL, EOMI. No scleral  icterus, conjunctivae clear. Neuro: CN II exam reveals vision grossly intact.  No nystagmus at any point of gaze. Ears: Auricles well formed without lesions.  Ear canals are intact without mass or lesion.  No erythema or edema is appreciated.  The TMs are intact without fluid. Nose: External evaluation reveals normal support and skin without lesions.  Dorsum is intact.  Anterior rhinoscopy reveals congested mucosa over anterior aspect of inferior turbinates and intact septum.  No purulence noted. Oral:  Oral cavity and oropharynx are intact, symmetric, without erythema or edema.  Mucosa is moist without lesions. Neck: Full range of motion without pain.  There is no significant lymphadenopathy.  No masses palpable.  Thyroid bed within normal limits to palpation.  Parotid glands and submandibular glands equal bilaterally without mass.  Trachea is midline. Neuro:  CN 2-12 grossly intact. Gait wide-based. Vestibular: No nystagmus at any point of gaze. Vestibular: There is no nystagmus with pneumatic pressure on either tympanic membrane or Valsalva. The cerebellar examination is unremarkable.   Assessment & Plan Right ear Mnire's disease with fluctuating hearing loss and episodic dizziness She reports mild dizziness, pressure, and tingling in the right ear, with occasional off-balance feelings. Symptoms are not severe, and there is no spinning sensation. She is on a less salt diet, which helps manage symptoms. - Continue less salt diet, aiming for 1500 mg of sodium per day. -The pathophysiology of Mnire's disease and dizziness are discussed with the patient.  Questions are invited and answered. - Avoid processed, canned, and fast foods. - Monitor symptoms and report any significant changes in hearing or dizziness. - Consider prednisone if there is a significant drop in hearing, to be taken within a few days of symptom onset.  Chronic rhinitis with nasal congestion Exacerbated by fall allergy season.  Flonase was ineffective, and she is currently using a generic form of Allegra, which she finds more effective. Congestion is likely due to a recent cold and exposure to children at work. - Continue using generic Allegra for allergy management. - Monitor symptoms and adjust treatment if necessary.

## 2024-05-31 ENCOUNTER — Telehealth: Payer: Self-pay | Admitting: Occupational Therapy

## 2024-05-31 NOTE — Telephone Encounter (Signed)
 Left a voicemail per DPR approval in chart as pt had called in to clinic asking questions about occupational therapy referral and referring diagnosis.  Encouraged pt to call back to further discuss next steps.

## 2024-06-18 ENCOUNTER — Ambulatory Visit: Admitting: Occupational Therapy

## 2024-09-03 ENCOUNTER — Ambulatory Visit (INDEPENDENT_AMBULATORY_CARE_PROVIDER_SITE_OTHER): Admitting: Otolaryngology
